# Patient Record
Sex: Female | Born: 1935
Health system: Southern US, Community
[De-identification: ages and names within clinical notes are randomized; demographics above are authoritative.]

## PROBLEM LIST (undated history)

## (undated) DIAGNOSIS — C801 Malignant (primary) neoplasm, unspecified: Secondary | ICD-10-CM

## (undated) DIAGNOSIS — I10 Essential (primary) hypertension: Secondary | ICD-10-CM

## (undated) HISTORY — PX: ABDOMINAL HYSTERECTOMY: SHX81

## (undated) HISTORY — PX: MASTECTOMY: SHX3

---

## 2004-07-11 ENCOUNTER — Ambulatory Visit: Payer: Self-pay | Admitting: Internal Medicine

## 2005-01-08 ENCOUNTER — Ambulatory Visit: Payer: Self-pay | Admitting: Oncology

## 2005-07-15 ENCOUNTER — Ambulatory Visit: Payer: Self-pay | Admitting: Internal Medicine

## 2006-01-07 ENCOUNTER — Ambulatory Visit: Payer: Self-pay | Admitting: Oncology

## 2006-01-24 ENCOUNTER — Emergency Department: Payer: Self-pay | Admitting: Emergency Medicine

## 2006-08-09 ENCOUNTER — Ambulatory Visit: Payer: Self-pay | Admitting: Internal Medicine

## 2007-07-28 ENCOUNTER — Emergency Department: Payer: Self-pay

## 2007-09-26 ENCOUNTER — Ambulatory Visit: Payer: Self-pay | Admitting: Internal Medicine

## 2008-09-26 ENCOUNTER — Ambulatory Visit: Payer: Self-pay | Admitting: Internal Medicine

## 2009-10-01 ENCOUNTER — Ambulatory Visit: Payer: Self-pay | Admitting: Internal Medicine

## 2010-10-02 ENCOUNTER — Ambulatory Visit: Payer: Self-pay | Admitting: Internal Medicine

## 2011-10-07 ENCOUNTER — Ambulatory Visit: Payer: Self-pay | Admitting: Internal Medicine

## 2012-10-10 ENCOUNTER — Ambulatory Visit: Payer: Self-pay | Admitting: Internal Medicine

## 2013-10-11 ENCOUNTER — Ambulatory Visit: Payer: Self-pay | Admitting: Internal Medicine

## 2013-12-13 ENCOUNTER — Emergency Department: Payer: Self-pay | Admitting: Emergency Medicine

## 2013-12-13 LAB — COMPREHENSIVE METABOLIC PANEL
ALBUMIN: 4 g/dL (ref 3.4–5.0)
ALK PHOS: 58 U/L
Anion Gap: 2 — ABNORMAL LOW (ref 7–16)
BUN: 11 mg/dL (ref 7–18)
Bilirubin,Total: 0.6 mg/dL (ref 0.2–1.0)
Calcium, Total: 8.9 mg/dL (ref 8.5–10.1)
Chloride: 107 mmol/L (ref 98–107)
Co2: 28 mmol/L (ref 21–32)
Creatinine: 0.53 mg/dL — ABNORMAL LOW (ref 0.60–1.30)
Glucose: 85 mg/dL (ref 65–99)
Osmolality: 272 (ref 275–301)
Potassium: 4.2 mmol/L (ref 3.5–5.1)
SGOT(AST): 32 U/L (ref 15–37)
SGPT (ALT): 19 U/L (ref 12–78)
Sodium: 137 mmol/L (ref 136–145)
Total Protein: 8.1 g/dL (ref 6.4–8.2)

## 2013-12-13 LAB — CBC
HCT: 38.7 % (ref 35.0–47.0)
HGB: 12.6 g/dL (ref 12.0–16.0)
MCH: 27.4 pg (ref 26.0–34.0)
MCHC: 32.7 g/dL (ref 32.0–36.0)
MCV: 84 fL (ref 80–100)
Platelet: 190 10*3/uL (ref 150–440)
RBC: 4.62 10*6/uL (ref 3.80–5.20)
RDW: 14.4 % (ref 11.5–14.5)
WBC: 4.6 10*3/uL (ref 3.6–11.0)

## 2013-12-13 LAB — TROPONIN I: Troponin-I: 0.03 ng/mL

## 2014-10-12 ENCOUNTER — Ambulatory Visit: Payer: Self-pay | Admitting: Internal Medicine

## 2014-10-12 DIAGNOSIS — Z1231 Encounter for screening mammogram for malignant neoplasm of breast: Secondary | ICD-10-CM | POA: Diagnosis not present

## 2014-10-12 DIAGNOSIS — Z853 Personal history of malignant neoplasm of breast: Secondary | ICD-10-CM | POA: Diagnosis not present

## 2014-11-05 DIAGNOSIS — I10 Essential (primary) hypertension: Secondary | ICD-10-CM | POA: Diagnosis not present

## 2015-05-06 DIAGNOSIS — N390 Urinary tract infection, site not specified: Secondary | ICD-10-CM | POA: Diagnosis not present

## 2015-05-06 DIAGNOSIS — E780 Pure hypercholesterolemia: Secondary | ICD-10-CM | POA: Diagnosis not present

## 2015-05-06 DIAGNOSIS — E538 Deficiency of other specified B group vitamins: Secondary | ICD-10-CM | POA: Diagnosis not present

## 2015-05-06 DIAGNOSIS — Z0001 Encounter for general adult medical examination with abnormal findings: Secondary | ICD-10-CM | POA: Diagnosis not present

## 2015-05-06 DIAGNOSIS — I10 Essential (primary) hypertension: Secondary | ICD-10-CM | POA: Diagnosis not present

## 2016-04-21 DIAGNOSIS — I10 Essential (primary) hypertension: Secondary | ICD-10-CM | POA: Diagnosis not present

## 2016-05-10 ENCOUNTER — Observation Stay
Admission: EM | Admit: 2016-05-10 | Discharge: 2016-05-11 | Disposition: A | Discharge status: Home / Self Care | Payer: Commercial Managed Care - HMO | Attending: Internal Medicine | Admitting: Internal Medicine

## 2016-05-10 ENCOUNTER — Emergency Department: Payer: Commercial Managed Care - HMO

## 2016-05-10 ENCOUNTER — Encounter: Payer: Self-pay | Admitting: Emergency Medicine

## 2016-05-10 DIAGNOSIS — Z8249 Family history of ischemic heart disease and other diseases of the circulatory system: Secondary | ICD-10-CM | POA: Insufficient documentation

## 2016-05-10 DIAGNOSIS — Z9010 Acquired absence of unspecified breast and nipple: Secondary | ICD-10-CM | POA: Insufficient documentation

## 2016-05-10 DIAGNOSIS — I10 Essential (primary) hypertension: Principal | ICD-10-CM | POA: Insufficient documentation

## 2016-05-10 DIAGNOSIS — Z853 Personal history of malignant neoplasm of breast: Secondary | ICD-10-CM | POA: Insufficient documentation

## 2016-05-10 DIAGNOSIS — R7989 Other specified abnormal findings of blood chemistry: Secondary | ICD-10-CM | POA: Diagnosis not present

## 2016-05-10 DIAGNOSIS — I160 Hypertensive urgency: Secondary | ICD-10-CM | POA: Diagnosis not present

## 2016-05-10 DIAGNOSIS — I6782 Cerebral ischemia: Secondary | ICD-10-CM | POA: Insufficient documentation

## 2016-05-10 DIAGNOSIS — R1110 Vomiting, unspecified: Secondary | ICD-10-CM | POA: Insufficient documentation

## 2016-05-10 DIAGNOSIS — Z90710 Acquired absence of both cervix and uterus: Secondary | ICD-10-CM | POA: Diagnosis not present

## 2016-05-10 DIAGNOSIS — R42 Dizziness and giddiness: Secondary | ICD-10-CM | POA: Insufficient documentation

## 2016-05-10 DIAGNOSIS — Z79899 Other long term (current) drug therapy: Secondary | ICD-10-CM | POA: Diagnosis not present

## 2016-05-10 DIAGNOSIS — I6523 Occlusion and stenosis of bilateral carotid arteries: Secondary | ICD-10-CM | POA: Diagnosis not present

## 2016-05-10 DIAGNOSIS — R748 Abnormal levels of other serum enzymes: Secondary | ICD-10-CM | POA: Diagnosis not present

## 2016-05-10 DIAGNOSIS — R778 Other specified abnormalities of plasma proteins: Secondary | ICD-10-CM

## 2016-05-10 HISTORY — DX: Malignant (primary) neoplasm, unspecified: C80.1

## 2016-05-10 HISTORY — DX: Essential (primary) hypertension: I10

## 2016-05-10 LAB — COMPREHENSIVE METABOLIC PANEL
ALT: 11 U/L — ABNORMAL LOW (ref 14–54)
AST: 19 U/L (ref 15–41)
Albumin: 4 g/dL (ref 3.5–5.0)
Alkaline Phosphatase: 42 U/L (ref 38–126)
Anion gap: 7 (ref 5–15)
BUN: 22 mg/dL — ABNORMAL HIGH (ref 6–20)
CHLORIDE: 106 mmol/L (ref 101–111)
CO2: 26 mmol/L (ref 22–32)
Calcium: 9.3 mg/dL (ref 8.9–10.3)
Creatinine, Ser: 0.73 mg/dL (ref 0.44–1.00)
Glucose, Bld: 105 mg/dL — ABNORMAL HIGH (ref 65–99)
POTASSIUM: 3.6 mmol/L (ref 3.5–5.1)
Sodium: 139 mmol/L (ref 135–145)
Total Bilirubin: 0.8 mg/dL (ref 0.3–1.2)
Total Protein: 7 g/dL (ref 6.5–8.1)

## 2016-05-10 LAB — CBC
HCT: 35.6 % (ref 35.0–47.0)
Hemoglobin: 12 g/dL (ref 12.0–16.0)
MCH: 28.8 pg (ref 26.0–34.0)
MCHC: 33.9 g/dL (ref 32.0–36.0)
MCV: 84.9 fL (ref 80.0–100.0)
PLATELETS: 165 10*3/uL (ref 150–440)
RBC: 4.19 MIL/uL (ref 3.80–5.20)
RDW: 14.4 % (ref 11.5–14.5)
WBC: 5.3 10*3/uL (ref 3.6–11.0)

## 2016-05-10 LAB — TROPONIN I: TROPONIN I: 0.04 ng/mL — AB (ref <0.03)

## 2016-05-10 MED ORDER — ACETAMINOPHEN 650 MG RE SUPP
650.0000 mg | Freq: Four times a day (QID) | RECTAL | Status: DC | PRN
Start: 2016-05-10 — End: 2016-05-11

## 2016-05-10 MED ORDER — IOPAMIDOL (ISOVUE-370) INJECTION 76%
75.0000 mL | Freq: Once | INTRAVENOUS | Status: AC | PRN
  Administered 2016-05-10: 75 mL via INTRAVENOUS

## 2016-05-10 MED ORDER — HYDRALAZINE HCL 20 MG/ML IJ SOLN: 10.0000 mg | INTRAMUSCULAR | Status: DC | PRN

## 2016-05-10 MED ORDER — ENOXAPARIN SODIUM 40 MG/0.4ML ~~LOC~~ SOLN
40.0000 mg | SUBCUTANEOUS | Status: DC
  Administered 2016-05-10: 40 mg via SUBCUTANEOUS
  Filled 2016-05-10: qty 0.4

## 2016-05-10 MED ORDER — ONDANSETRON HCL 4 MG/2ML IJ SOLN: 4.0000 mg | Freq: Four times a day (QID) | INTRAMUSCULAR | Status: DC | PRN

## 2016-05-10 MED ORDER — ONDANSETRON HCL 4 MG PO TABS: 4.0000 mg | ORAL_TABLET | Freq: Four times a day (QID) | ORAL | Status: DC | PRN

## 2016-05-10 MED ORDER — OXYCODONE HCL 5 MG PO TABS: 5.0000 mg | ORAL_TABLET | ORAL | Status: DC | PRN

## 2016-05-10 MED ORDER — SODIUM CHLORIDE 0.9% FLUSH
3.0000 mL | Freq: Two times a day (BID) | INTRAVENOUS | Status: DC
  Administered 2016-05-10 – 2016-05-11 (×3): 3 mL via INTRAVENOUS

## 2016-05-10 MED ORDER — ACETAMINOPHEN 325 MG PO TABS
650.0000 mg | ORAL_TABLET | Freq: Four times a day (QID) | ORAL | Status: DC | PRN
Start: 2016-05-10 — End: 2016-05-11

## 2016-05-10 MED ORDER — VERAPAMIL HCL ER 240 MG PO TBCR
240.0000 mg | EXTENDED_RELEASE_TABLET | Freq: Every evening | ORAL | Status: DC
  Administered 2016-05-10: 240 mg via ORAL
  Filled 2016-05-10 (×2): qty 1

## 2016-05-10 MED ORDER — ASPIRIN 81 MG PO CHEW
324.0000 mg | CHEWABLE_TABLET | Freq: Once | ORAL | Status: AC
  Administered 2016-05-10: 324 mg via ORAL
  Filled 2016-05-10: qty 4

## 2016-05-10 MED ORDER — CLONIDINE HCL 0.1 MG PO TABS
0.1000 mg | ORAL_TABLET | Freq: Once | ORAL | Status: AC
  Administered 2016-05-10: 0.1 mg via ORAL
  Filled 2016-05-10: qty 1

## 2016-05-10 NOTE — ED Provider Notes (Signed)
   Imaging results of CT a head and neck: IMPRESSION: No significant carotid or vertebral artery stenosis in the neck  Atherosclerotic disease in the cavernous carotid bilaterally with mild stenosis on the right and moderate stenosis on the left. Mild disease in the M1 segment bilaterally. Mild stenosis in the proximal posterior cerebral artery bilaterally. No emergent large vessel Occlusion.  ______________________________________   I reviewed the CTA head and neck and there is no evidence of acute occlusion amenable to intervention.  I spoke with the hospitalist, and Dr. Owens Shark had previously spoke with hospitalist for admission.    Lisa Roca, MD 05/10/16 [phone]

## 2016-05-10 NOTE — Care Management Obs Status (Signed)
[facility] NOTIFICATION   Patient Details  Name: Brittany Christian MRN: [id number] Date of Birth: 03-May-1931   Medicare Observation Status Notification Given:   Yes    CrutchfieldAntony Haste, RN 05/10/2016, 8:06 PM

## 2016-05-10 NOTE — Progress Notes (Signed)
Patient admitted to unit. Oriented to room, call bell, and staff. Bed in lowest position. Fall safety plan reviewed. Full assessment to Epic. Skin assessment verified with Charlean Sanfilippo. Telemetry box verification with tele clerk and Gerald Stabs NT- Box#: [id number]. Will continue to monitor. Multiple family members at bedside.

## 2016-05-10 NOTE — ED Triage Notes (Signed)
Patient presents with c/o of acute onset of N/V, weakness, and feeling "out of it". Symptoms started after eating "some chicken". Patient seen by PCP last week and has was given new medication for her BP.

## 2016-05-10 NOTE — ED Notes (Signed)
Dr. Owens Shark to bedside for rapid patient assessment and evaluation.

## 2016-05-10 NOTE — H&P (Signed)
[facility] [place] NAME: Brittany Christian    MR#:  [id number]  DATE OF BIRTH:  08-04-1931   DATE OF ADMISSION:  05/10/2016  PRIMARY CARE PHYSICIAN: Madelyn Brunner, MD   REQUESTING/REFERRING PHYSICIAN: Lord  CHIEF COMPLAINT:   Chief Complaint  Patient presents with  . Weakness  . Nausea  . Emesis    HISTORY OF PRESENT ILLNESS:  Brittany Christian  is a 82 y.o. female with a known history of Essential hypertension who is presenting with nauseousness and dizziness. She states that had one day duration of symptoms of nauseousness with nonbloody nonbilious emesis, generalized weakness, dizziness which she states "felt like I was going to pass out" she denies any vertiginous symptoms. Because of these symptoms present Hospital further workup and evaluation. On arrival noted to have elevated blood pressure 200s over 80s. Had a repeat episode of dizziness which at that time led emergency room provider to be concern for posterior circulation issue. CTA performed within normal limits  PAST MEDICAL HISTORY:   Past Medical History:  Diagnosis Date  . Cancer ([place])   . Hypertension     PAST SURGICAL HISTORY:   Past Surgical History:  Procedure Laterality Date  . ABDOMINAL HYSTERECTOMY    . MASTECTOMY      SOCIAL HISTORY:   Social History  Substance Use Topics  . Smoking status: Never Smoker  . Smokeless tobacco: Never Used  . Alcohol use No    FAMILY HISTORY:   Family History  Problem Relation Age of Onset  . Hypertension Other     DRUG ALLERGIES:  No Known Allergies  REVIEW OF SYSTEMS:  REVIEW OF SYSTEMS:  CONSTITUTIONAL: Denies fevers, chills, Positive fatigue, weakness.  EYES: Denies blurred vision, double vision, or eye pain.  EARS, NOSE, THROAT: Denies tinnitus, ear pain, hearing loss.  RESPIRATORY: denies cough, shortness of breath, wheezing  CARDIOVASCULAR: Denies chest pain, palpitations, edema.  GASTROINTESTINAL:  Denies nausea, vomiting, diarrhea, abdominal pain.  GENITOURINARY: Denies dysuria, hematuria.  ENDOCRINE: Denies nocturia or thyroid problems. HEMATOLOGIC AND LYMPHATIC: Denies easy bruising or bleeding.  SKIN: Denies rash or lesions.  MUSCULOSKELETAL: Denies pain in neck, back, shoulder, knees, hips, or further arthritic symptoms.  NEUROLOGIC: Denies paralysis, paresthesias.  PSYCHIATRIC: Denies anxiety or depressive symptoms. Otherwise full review of systems performed by me is negative.   MEDICATIONS AT HOME:   Prior to Admission medications   Medication Sig Start Date End Date Taking? Authorizing Provider  verapamil (CALAN-SR) 240 MG CR tablet Take 240 mg by mouth every evening.   Yes Historical Provider, MD      VITAL SIGNS:  Blood pressure (!) 164/70, pulse 65, resp. rate 18, SpO2 100 %.  PHYSICAL EXAMINATION:  VITAL SIGNS: Vitals:   05/10/16 0830 05/10/16 0900  BP: (!) 148/70 (!) 164/70  Pulse: (!) 55 65  Resp: 57 18   GENERAL:82 y.o.female currently in no acute distress.  HEAD: Normocephalic, atraumatic.  EYES: Pupils equal, round, reactive to light. Extraocular muscles intact. No scleral icterus.  MOUTH: Moist mucosal membrane. Dentition intact. No abscess noted.  EAR, NOSE, THROAT: Clear without exudates. No external lesions.  NECK: Supple. No thyromegaly. No nodules. No JVD.  PULMONARY: Clear to ascultation, without wheeze rails or rhonci. No use of accessory muscles, Good respiratory effort. good air entry bilaterally CHEST: Nontender to palpation.  CARDIOVASCULAR: S1 and S2. Regular rate and rhythm. No murmurs, rubs, or gallops. No edema. Pedal pulses 2+ bilaterally.  GASTROINTESTINAL: Soft, nontender, nondistended. No masses. Positive bowel sounds. No hepatosplenomegaly.  MUSCULOSKELETAL: No swelling, clubbing, or edema. Range of motion full in all extremities.  NEUROLOGIC: Cranial nerves II through XII are intact. No gross focal neurological deficits. Sensation  intact. Reflexes intact.  SKIN: No ulceration, lesions, rashes, or cyanosis. Skin warm and dry. Turgor intact.  PSYCHIATRIC: Mood, affect within normal limits. The patient is awake, alert and oriented x 3. Insight, judgment intact.    LABORATORY PANEL:   CBC  Recent Labs Lab 05/10/16 0458  WBC 5.3  HGB 12.0  HCT 35.6  PLT 165   ------------------------------------------------------------------------------------------------------------------  Chemistries   Recent Labs Lab 05/10/16 0458  NA 139  K 3.6  CL 106  CO2 26  GLUCOSE 105*  BUN 22*  CREATININE 0.73  CALCIUM 9.3  AST 19  ALT 11*  ALKPHOS 42  BILITOT 0.8   ------------------------------------------------------------------------------------------------------------------  Cardiac Enzymes  Recent Labs Lab 05/10/16 0458  TROPONINI 0.04*   ------------------------------------------------------------------------------------------------------------------  RADIOLOGY:  Ct Angio Head W Or Wo Contrast  Result Date: 05/10/2016 CLINICAL DATA:  Dizziness.  Hypertension.  History of breast cancer EXAM: CT ANGIOGRAPHY HEAD AND NECK TECHNIQUE: Multidetector CT imaging of the head and neck was performed using the standard protocol during bolus administration of intravenous contrast. Multiplanar CT image reconstructions and MIPs were obtained to evaluate the vascular anatomy. Carotid stenosis measurements (when applicable) are obtained utilizing NASCET criteria, using the distal internal carotid diameter as the denominator. CONTRAST:  75 mL Isovue 370 IV COMPARISON:  CT head 05/10/2016 FINDINGS: CTA NECK Aortic arch: Mild atherosclerotic disease in the aortic arch. Proximal great vessels widely patent with mild atherosclerotic disease in the proximal left subclavian artery. Right carotid system: Right common carotid artery widely patent. Mild atherosclerotic disease in the carotid bulb without significant stenosis Left carotid  system: Left common carotid artery widely patent. Minimal atherosclerotic disease at the left carotid bifurcation without significant stenosis Vertebral arteries:Both vertebral arteries are equal in size and patent to the basilar without significant stenosis. Skeleton: Moderate to advanced cervical disc and facet degeneration. No acute skeletal abnormality. Other neck: Negative for mass or adenopathy Upper chest: Lung apices clear. CTA HEAD Anterior circulation: Atherosclerotic calcification and mild stenosis in the right cavernous carotid. Moderate stenosis left cavernous carotid. Mild atherosclerotic disease in the M1 segment bilaterally. Middle cerebral artery branches widely patent bilaterally. Posterior circulation: Both vertebral arteries patent to the basilar. PICA patent bilaterally. Basilar widely patent. AICA, superior cerebellar, posterior cerebral arteries patent bilaterally. Mild stenosis of the proximal posterior cerebral artery bilaterally. Venous sinuses: Patent Anatomic variants: Negative for aneurysm. Delayed phase: Normal enhancement on delayed imaging. No enhancing mass lesion. Atrophy and chronic microvascular ischemic changes. IMPRESSION: No significant carotid or vertebral artery stenosis in the neck Atherosclerotic disease in the cavernous carotid bilaterally with mild stenosis on the right and moderate stenosis on the left. Mild disease in the M1 segment bilaterally. Mild stenosis in the proximal posterior cerebral artery bilaterally. No emergent large vessel occlusion. Electronically Signed   By: Franchot Gallo M.D.   On: 05/10/2016 09:48   Ct Head Wo Contrast  Result Date: 05/10/2016 CLINICAL DATA:  Acute onset of nausea, vomiting and generalized weakness. Initial encounter. EXAM: CT HEAD WITHOUT CONTRAST TECHNIQUE: Contiguous axial images were obtained from the base of the skull through the vertex without intravenous contrast. COMPARISON:  None. FINDINGS: There is no evidence of acute  infarction, mass lesion, or intra- or extra-axial hemorrhage on CT. Prominence of the ventricles  and sulci reflects mild cortical volume loss. Mild cerebellar atrophy is noted. Scattered periventricular and subcortical white matter change likely reflects small vessel ischemic microangiopathy. The brainstem and fourth ventricle are within normal limits. The basal ganglia are unremarkable in appearance. The cerebral hemispheres demonstrate grossly normal gray-white differentiation. No mass effect or midline shift is seen. There is no evidence of fracture; visualized osseous structures are unremarkable in appearance. The visualized portions of the orbits are within normal limits. The paranasal sinuses and mastoid air cells are well-aerated. No significant soft tissue abnormalities are seen. IMPRESSION: 1. No acute intracranial pathology seen on CT. 2. Mild cortical volume loss and scattered small vessel ischemic microangiopathy. Electronically Signed   By: Garald Balding M.D.   On: 05/10/2016 06:00   Ct Angio Neck W Or Wo Contrast  Result Date: 05/10/2016 CLINICAL DATA:  Dizziness.  Hypertension.  History of breast cancer EXAM: CT ANGIOGRAPHY HEAD AND NECK TECHNIQUE: Multidetector CT imaging of the head and neck was performed using the standard protocol during bolus administration of intravenous contrast. Multiplanar CT image reconstructions and MIPs were obtained to evaluate the vascular anatomy. Carotid stenosis measurements (when applicable) are obtained utilizing NASCET criteria, using the distal internal carotid diameter as the denominator. CONTRAST:  75 mL Isovue 370 IV COMPARISON:  CT head 05/10/2016 FINDINGS: CTA NECK Aortic arch: Mild atherosclerotic disease in the aortic arch. Proximal great vessels widely patent with mild atherosclerotic disease in the proximal left subclavian artery. Right carotid system: Right common carotid artery widely patent. Mild atherosclerotic disease in the carotid bulb without  significant stenosis Left carotid system: Left common carotid artery widely patent. Minimal atherosclerotic disease at the left carotid bifurcation without significant stenosis Vertebral arteries:Both vertebral arteries are equal in size and patent to the basilar without significant stenosis. Skeleton: Moderate to advanced cervical disc and facet degeneration. No acute skeletal abnormality. Other neck: Negative for mass or adenopathy Upper chest: Lung apices clear. CTA HEAD Anterior circulation: Atherosclerotic calcification and mild stenosis in the right cavernous carotid. Moderate stenosis left cavernous carotid. Mild atherosclerotic disease in the M1 segment bilaterally. Middle cerebral artery branches widely patent bilaterally. Posterior circulation: Both vertebral arteries patent to the basilar. PICA patent bilaterally. Basilar widely patent. AICA, superior cerebellar, posterior cerebral arteries patent bilaterally. Mild stenosis of the proximal posterior cerebral artery bilaterally. Venous sinuses: Patent Anatomic variants: Negative for aneurysm. Delayed phase: Normal enhancement on delayed imaging. No enhancing mass lesion. Atrophy and chronic microvascular ischemic changes. IMPRESSION: No significant carotid or vertebral artery stenosis in the neck Atherosclerotic disease in the cavernous carotid bilaterally with mild stenosis on the right and moderate stenosis on the left. Mild disease in the M1 segment bilaterally. Mild stenosis in the proximal posterior cerebral artery bilaterally. No emergent large vessel occlusion. Electronically Signed   By: Franchot Gallo M.D.   On: 05/10/2016 09:48    EKG:   Orders placed or performed during the hospital encounter of 05/10/16  . EKG 12-Lead  . EKG 12-Lead    IMPRESSION AND PLAN:   82 year old African-American female history of essential hypertension presenting with generalized weakness and dizziness  1. Hypertensive urgency: Restart home medications,  add as needed hydralazine 2. Borderline troponin: Likely demand trend cardiac enzymes 3 placed on telemetry 3. Venous thrombi embolism prophylactic: Lovenox    All the records are reviewed and case discussed with ED provider. Management plans discussed with the patient, family and they are in agreement.  CODE STATUS: Full  TOTAL TIME TAKING CARE OF  THIS PATIENT: 33 minutes.    Stryker Veasey,  Karenann Cai.D on 05/10/2016 at 10:48 AM  Between 7am to 6pm - Pager - [phone]  After 6pm: House Pager: - [phone]  [facility]  Office  [phone]  CC: Primary care physician; Madelyn Brunner, MD

## 2016-05-10 NOTE — ED Notes (Signed)
Patient transported to CT 

## 2016-05-10 NOTE — ED Notes (Signed)
Pt alert and oriented. NAD. Respirations unlabored. Skin warm dry and pink. No needs at this time.

## 2016-05-10 NOTE — ED Provider Notes (Signed)
[facility] Emergency Department Provider Note  ____________________________________________   First MD Initiated Contact with Patient 05/10/16 [phone]     (approximate)  I have reviewed the triage vital signs and the nursing notes.   HISTORY  Chief Complaint Weakness; Nausea; and Emesis    HPI Brittany Christian is a 82 y.o. female presents with acute onset of generalized weakness nausea and 1 episode of nonbloody emesis before presentation to the emergency department. Patient states that she had a similar episode last Sunday while at church   Past medical history Hypertension Breast cancer There are no active problems to display for this patient.  Past surgical history None  Prior to Admission medications   Medication Sig Start Date End Date Taking? Authorizing Provider  verapamil (CALAN-SR) 240 MG CR tablet Take 240 mg by mouth every evening.   Yes Historical Provider, MD    Allergies No known drug allergies No family history on file.  Social History Social History  Substance Use Topics  . Smoking status: Not on file  . Smokeless tobacco: Not on file  . Alcohol use Not on file    Review of Systems Constitutional: No fever/chills Eyes: No visual changes. ENT: No sore throat. Cardiovascular: Denies chest pain. Respiratory: Denies shortness of breath. Gastrointestinal: No abdominal pain.  No nausea, no vomiting.  No diarrhea.  No constipation. Genitourinary: Negative for dysuria. Musculoskeletal: Negative for back pain. Skin: Negative for rash. Neurological: Negative for headaches, focal weakness or numbness.  10-point ROS otherwise negative.  ____________________________________________   PHYSICAL EXAM:  VITAL SIGNS: ED Triage Vitals  Enc Vitals Group     BP 05/10/16 0443 (!) 202/82     Pulse Rate 05/10/16 0443 67     Resp 05/10/16 0443 14     Temp --      Temp src --      SpO2 05/10/16 0443 100 %     Weight --    Height --      Head Circumference --      Peak Flow --      Pain Score 05/10/16 0442 0     Pain Loc --      Pain Edu? --      Excl. in [place]? --     Constitutional: Alert and oriented. Well appearing and in no acute distress. Eyes: Conjunctivae are normal. PERRL. EOMI. Head: Atraumatic. Ears:  Healthy appearing ear canals and TMs bilaterally Nose: No congestion/rhinnorhea. Mouth/Throat: Mucous membranes are moist.  Oropharynx non-erythematous. Neck: No stridor.  No meningeal signs.   Cardiovascular: Normal rate, regular rhythm. Good peripheral circulation. Grossly normal heart sounds.   Respiratory: Normal respiratory effort.  No retractions. Lungs CTAB. Gastrointestinal: Soft and nontender. No distention.  Musculoskeletal: No lower extremity tenderness nor edema. No gross deformities of extremities. Neurologic:  Normal speech and language. No gross focal neurologic deficits are appreciated.  Skin:  Skin is warm, dry and intact. No rash noted. Psychiatric: Mood and affect are normal. Speech and behavior are normal.  ____________________________________________   LABS (all labs ordered are listed, but only abnormal results are displayed)  Labs Reviewed  COMPREHENSIVE METABOLIC PANEL - Abnormal; Notable for the following:       Result Value   Glucose, Bld 105 (*)    BUN 22 (*)    ALT 11 (*)    All other components within normal limits  TROPONIN I - Abnormal; Notable for the following:    Troponin I 0.04 (*)  All other components within normal limits  CBC   ____________________________________________  EKG  ED ECG REPORT I, Sciota N Zawadi Aplin, the attending physician, personally viewed and interpreted this ECG.   Date: 05/10/2016  EKG Time: 4:48 AM  Rate: 57  Rhythm: Normal sinus rhythm  Axis: Normal  Intervals: Normal  ST&T Change: None  ____________________________________________  RADIOLOGY I, [place] N Munir Victorian, personally viewed and evaluated these images  (plain radiographs) as part of my medical decision making, as well as reviewing the written report by the radiologist.  Ct Head Wo Contrast  Result Date: 05/10/2016 CLINICAL DATA:  Acute onset of nausea, vomiting and generalized weakness. Initial encounter. EXAM: CT HEAD WITHOUT CONTRAST TECHNIQUE: Contiguous axial images were obtained from the base of the skull through the vertex without intravenous contrast. COMPARISON:  None. FINDINGS: There is no evidence of acute infarction, mass lesion, or intra- or extra-axial hemorrhage on CT. Prominence of the ventricles and sulci reflects mild cortical volume loss. Mild cerebellar atrophy is noted. Scattered periventricular and subcortical white matter change likely reflects small vessel ischemic microangiopathy. The brainstem and fourth ventricle are within normal limits. The basal ganglia are unremarkable in appearance. The cerebral hemispheres demonstrate grossly normal gray-white differentiation. No mass effect or midline shift is seen. There is no evidence of fracture; visualized osseous structures are unremarkable in appearance. The visualized portions of the orbits are within normal limits. The paranasal sinuses and mastoid air cells are well-aerated. No significant soft tissue abnormalities are seen. IMPRESSION: 1. No acute intracranial pathology seen on CT. 2. Mild cortical volume loss and scattered small vessel ischemic microangiopathy. Electronically Signed   By: Garald Balding M.D.   On: 05/10/2016 06:00    Procedures      INITIAL IMPRESSION / ASSESSMENT AND PLAN / ED COURSE  Pertinent labs & imaging results that were available during my care of the patient were reviewed by me and considered in my medical decision making (see chart for details).  History of physical exam concerning for possible demand ischemia given elevated troponin with marked hypertension. Also concern for possible posterior circulation pathology given patient's  intermittent dizziness weakness in the setting of marked the elevated blood pressure. Patient discussed with Dr. Marcille Blanco for hospital admission for further evaluation and management.  Clinical Course    ____________________________________________  FINAL CLINICAL IMPRESSION(S) / ED DIAGNOSES  Final diagnoses:  Elevated troponin  Essential hypertension     MEDICATIONS GIVEN DURING THIS VISIT:  Medications  cloNIDine (CATAPRES) tablet 0.1 mg (0.1 mg Oral Given 05/10/16 0613)     NEW OUTPATIENT MEDICATIONS STARTED DURING THIS VISIT:  New Prescriptions   No medications on file      Note:  This document was prepared using Dragon voice recognition software and may include unintentional dictation errors.    Gregor Hams, MD 05/10/16 [phone]

## 2016-05-11 DIAGNOSIS — R748 Abnormal levels of other serum enzymes: Secondary | ICD-10-CM | POA: Diagnosis not present

## 2016-05-11 DIAGNOSIS — I160 Hypertensive urgency: Secondary | ICD-10-CM | POA: Diagnosis not present

## 2016-05-11 LAB — BASIC METABOLIC PANEL
Anion gap: 6 (ref 5–15)
BUN: 20 mg/dL (ref 6–20)
CALCIUM: 9.1 mg/dL (ref 8.9–10.3)
CO2: 25 mmol/L (ref 22–32)
CREATININE: 0.63 mg/dL (ref 0.44–1.00)
Chloride: 106 mmol/L (ref 101–111)
GFR calc Af Amer: >60 mL/min (ref >60)
Glucose, Bld: 89 mg/dL (ref 65–99)
Potassium: 3.7 mmol/L (ref 3.5–5.1)
SODIUM: 137 mmol/L (ref 135–145)

## 2016-05-11 LAB — CBC
HCT: 35.4 % (ref 35.0–47.0)
Hemoglobin: 12 g/dL (ref 12.0–16.0)
MCH: 28.6 pg (ref 26.0–34.0)
MCHC: 34 g/dL (ref 32.0–36.0)
MCV: 84.2 fL (ref 80.0–100.0)
PLATELETS: 186 10*3/uL (ref 150–440)
RBC: 4.21 MIL/uL (ref 3.80–5.20)
RDW: 14.4 % (ref 11.5–14.5)
WBC: 4.5 10*3/uL (ref 3.6–11.0)

## 2016-05-11 NOTE — Discharge Summary (Signed)
[facility] NAME: Debborah Ghezzi    MR#:  [id number]  DATE OF BIRTH:  1931/04/15  DATE OF ADMISSION:  05/10/2016 ADMITTING PHYSICIAN: Lytle Butte, MD  DATE OF DISCHARGE: 05/11/16  PRIMARY CARE PHYSICIAN: Madelyn Brunner, MD    ADMISSION DIAGNOSIS:  Dizziness [R42] Hypertension [I10] Elevated troponin [R79.89] Essential hypertension [I10]  DISCHARGE DIAGNOSIS:  Malignant HTN Vomiting with dizziness-resolved  SECONDARY DIAGNOSIS:   Past Medical History:  Diagnosis Date  . Cancer ([place])   . Hypertension     HOSPITAL COURSE:  82 year old African-American female history of essential hypertension presenting with generalized weakness and dizziness  1. Hypertensive urgency: Restart home medications, add as needed hydralazine -BP muh impoved -no vomiting  2. Borderline troponin: Likely demand ischemia from HTN -no cp  3. Venous thrombi embolism prophylactic: Lovenox  Overall improved from her presenting symptoms. Will d/c her after lunch  CONSULTS OBTAINED:  Treatment Team:  Lytle Butte, MD  DRUG ALLERGIES:  No Known Allergies  DISCHARGE MEDICATIONS:   Current Discharge Medication List    CONTINUE these medications which have NOT CHANGED   Details  verapamil (CALAN-SR) 240 MG CR tablet Take 240 mg by mouth every evening.        If you experience worsening of your admission symptoms, develop shortness of breath, life threatening emergency, suicidal or homicidal thoughts you must seek medical attention immediately by calling 911 or calling your MD immediately  if symptoms less severe.  You Must read complete instructions/literature along with all the possible adverse reactions/side effects for all the Medicines you take and that have been prescribed to you. Take any new Medicines after you have completely understood and accept all the possible adverse reactions/side effects.   Please note  You  were cared for by a hospitalist during your hospital stay. If you have any questions about your discharge medications or the care you received while you were in the hospital after you are discharged, you can call the unit and asked to speak with the hospitalist on call if the hospitalist that took care of you is not available. Once you are discharged, your primary care physician will handle any further medical issues. Please note that NO REFILLS for any discharge medications will be authorized once you are discharged, as it is imperative that you return to your primary care physician (or establish a relationship with a primary care physician if you do not have one) for your aftercare needs so that they can reassess your need for medications and monitor your lab values. Today   SUBJECTIVE   No new complaints wants to go home  VITAL SIGNS:  Blood pressure (!) 160/82, pulse (!) 52, temperature 98.2 F (36.8 C), temperature source Oral, resp. rate 15, height 5\' 7"  (1.702 m), weight 130 lb (59 kg), SpO2 100 %.  I/O:   Intake/Output Summary (Last 24 hours) at 05/11/16 1114 Last data filed at 05/11/16 1055  Gross per 24 hour  Intake              480 ml  Output             1150 ml  Net             -670 ml    PHYSICAL EXAMINATION:  GENERAL:  82 y.o.-year-old patient lying in the bed with no acute distress.  EYES: Pupils equal, round, reactive to light and accommodation. No scleral icterus. Extraocular  muscles intact.  HEENT: Head atraumatic, normocephalic. Oropharynx and nasopharynx clear.  NECK:  Supple, no jugular venous distention. No thyroid enlargement, no tenderness.  LUNGS: Normal breath sounds bilaterally, no wheezing, rales,rhonchi or crepitation. No use of accessory muscles of respiration.  CARDIOVASCULAR: S1, S2 normal. No murmurs, rubs, or gallops.  ABDOMEN: Soft, non-tender, non-distended. Bowel sounds present. No organomegaly or mass.  EXTREMITIES: No pedal edema, cyanosis, or  clubbing.  NEUROLOGIC: Cranial nerves II through XII are intact. Muscle strength 5/5 in all extremities. Sensation intact. Gait not checked.  PSYCHIATRIC: The patient is alert and oriented x 3.  SKIN: No obvious rash, lesion, or ulcer.   DATA REVIEW:   CBC   Recent Labs Lab 05/11/16 0600  WBC 4.5  HGB 12.0  HCT 35.4  PLT 186    Chemistries   Recent Labs Lab 05/10/16 0458 05/11/16 0600  NA 139 137  K 3.6 3.7  CL 106 106  CO2 26 25  GLUCOSE 105* 89  BUN 22* 20  CREATININE 0.73 0.63  CALCIUM 9.3 9.1  AST 19  --   ALT 11*  --   ALKPHOS 42  --   BILITOT 0.8  --     Microbiology Results   No results found for this or any previous visit (from the past 240 hour(s)).  RADIOLOGY:  Ct Angio Head W Or Wo Contrast  Result Date: 05/10/2016 CLINICAL DATA:  Dizziness.  Hypertension.  History of breast cancer EXAM: CT ANGIOGRAPHY HEAD AND NECK TECHNIQUE: Multidetector CT imaging of the head and neck was performed using the standard protocol during bolus administration of intravenous contrast. Multiplanar CT image reconstructions and MIPs were obtained to evaluate the vascular anatomy. Carotid stenosis measurements (when applicable) are obtained utilizing NASCET criteria, using the distal internal carotid diameter as the denominator. CONTRAST:  75 mL Isovue 370 IV COMPARISON:  CT head 05/10/2016 FINDINGS: CTA NECK Aortic arch: Mild atherosclerotic disease in the aortic arch. Proximal great vessels widely patent with mild atherosclerotic disease in the proximal left subclavian artery. Right carotid system: Right common carotid artery widely patent. Mild atherosclerotic disease in the carotid bulb without significant stenosis Left carotid system: Left common carotid artery widely patent. Minimal atherosclerotic disease at the left carotid bifurcation without significant stenosis Vertebral arteries:Both vertebral arteries are equal in size and patent to the basilar without significant  stenosis. Skeleton: Moderate to advanced cervical disc and facet degeneration. No acute skeletal abnormality. Other neck: Negative for mass or adenopathy Upper chest: Lung apices clear. CTA HEAD Anterior circulation: Atherosclerotic calcification and mild stenosis in the right cavernous carotid. Moderate stenosis left cavernous carotid. Mild atherosclerotic disease in the M1 segment bilaterally. Middle cerebral artery branches widely patent bilaterally. Posterior circulation: Both vertebral arteries patent to the basilar. PICA patent bilaterally. Basilar widely patent. AICA, superior cerebellar, posterior cerebral arteries patent bilaterally. Mild stenosis of the proximal posterior cerebral artery bilaterally. Venous sinuses: Patent Anatomic variants: Negative for aneurysm. Delayed phase: Normal enhancement on delayed imaging. No enhancing mass lesion. Atrophy and chronic microvascular ischemic changes. IMPRESSION: No significant carotid or vertebral artery stenosis in the neck Atherosclerotic disease in the cavernous carotid bilaterally with mild stenosis on the right and moderate stenosis on the left. Mild disease in the M1 segment bilaterally. Mild stenosis in the proximal posterior cerebral artery bilaterally. No emergent large vessel occlusion. Electronically Signed   By: Franchot Gallo M.D.   On: 05/10/2016 09:48   Ct Head Wo Contrast  Result Date: 05/10/2016 CLINICAL DATA:  Acute onset of nausea, vomiting and generalized weakness. Initial encounter. EXAM: CT HEAD WITHOUT CONTRAST TECHNIQUE: Contiguous axial images were obtained from the base of the skull through the vertex without intravenous contrast. COMPARISON:  None. FINDINGS: There is no evidence of acute infarction, mass lesion, or intra- or extra-axial hemorrhage on CT. Prominence of the ventricles and sulci reflects mild cortical volume loss. Mild cerebellar atrophy is noted. Scattered periventricular and subcortical white matter change likely  reflects small vessel ischemic microangiopathy. The brainstem and fourth ventricle are within normal limits. The basal ganglia are unremarkable in appearance. The cerebral hemispheres demonstrate grossly normal gray-white differentiation. No mass effect or midline shift is seen. There is no evidence of fracture; visualized osseous structures are unremarkable in appearance. The visualized portions of the orbits are within normal limits. The paranasal sinuses and mastoid air cells are well-aerated. No significant soft tissue abnormalities are seen. IMPRESSION: 1. No acute intracranial pathology seen on CT. 2. Mild cortical volume loss and scattered small vessel ischemic microangiopathy. Electronically Signed   By: Garald Balding M.D.   On: 05/10/2016 06:00   Ct Angio Neck W Or Wo Contrast  Result Date: 05/10/2016 CLINICAL DATA:  Dizziness.  Hypertension.  History of breast cancer EXAM: CT ANGIOGRAPHY HEAD AND NECK TECHNIQUE: Multidetector CT imaging of the head and neck was performed using the standard protocol during bolus administration of intravenous contrast. Multiplanar CT image reconstructions and MIPs were obtained to evaluate the vascular anatomy. Carotid stenosis measurements (when applicable) are obtained utilizing NASCET criteria, using the distal internal carotid diameter as the denominator. CONTRAST:  75 mL Isovue 370 IV COMPARISON:  CT head 05/10/2016 FINDINGS: CTA NECK Aortic arch: Mild atherosclerotic disease in the aortic arch. Proximal great vessels widely patent with mild atherosclerotic disease in the proximal left subclavian artery. Right carotid system: Right common carotid artery widely patent. Mild atherosclerotic disease in the carotid bulb without significant stenosis Left carotid system: Left common carotid artery widely patent. Minimal atherosclerotic disease at the left carotid bifurcation without significant stenosis Vertebral arteries:Both vertebral arteries are equal in size and  patent to the basilar without significant stenosis. Skeleton: Moderate to advanced cervical disc and facet degeneration. No acute skeletal abnormality. Other neck: Negative for mass or adenopathy Upper chest: Lung apices clear. CTA HEAD Anterior circulation: Atherosclerotic calcification and mild stenosis in the right cavernous carotid. Moderate stenosis left cavernous carotid. Mild atherosclerotic disease in the M1 segment bilaterally. Middle cerebral artery branches widely patent bilaterally. Posterior circulation: Both vertebral arteries patent to the basilar. PICA patent bilaterally. Basilar widely patent. AICA, superior cerebellar, posterior cerebral arteries patent bilaterally. Mild stenosis of the proximal posterior cerebral artery bilaterally. Venous sinuses: Patent Anatomic variants: Negative for aneurysm. Delayed phase: Normal enhancement on delayed imaging. No enhancing mass lesion. Atrophy and chronic microvascular ischemic changes. IMPRESSION: No significant carotid or vertebral artery stenosis in the neck Atherosclerotic disease in the cavernous carotid bilaterally with mild stenosis on the right and moderate stenosis on the left. Mild disease in the M1 segment bilaterally. Mild stenosis in the proximal posterior cerebral artery bilaterally. No emergent large vessel occlusion. Electronically Signed   By: Franchot Gallo M.D.   On: 05/10/2016 09:48     Management plans discussed with the patient, family and they are in agreement.  CODE STATUS:     Code Status Orders        Start     Ordered   05/10/16 1029  Full code  Continuous     05/10/16  88    Code Status History    Date Active Date Inactive Code Status Order ID Comments User Context   This patient has a current code status but no historical code status.      TOTAL TIME TAKING CARE OF THIS PATIENT: 40 minutes.    Demiah Gullickson M.D on 05/11/2016 at 11:14 AM  Between 7am to 6pm - Pager - [phone] After 6pm go to  [email or web address] - password [email or web address] [facility]  Office  [phone]  CC: Primary care physician; Madelyn Brunner, MD

## 2016-05-11 NOTE — Progress Notes (Signed)
Spoke with Dr. Posey Pronto about pt. SBP 160's MD is ok with pt. Discharging

## 2016-05-11 NOTE — Progress Notes (Signed)
Pt. Slept throughout the night with no c/o pain, SOB or acute distress observed. Daughter stayed at bedside throughout the night. Will continue to monitor pt.

## 2016-05-11 NOTE — Progress Notes (Signed)
Discharge instructions given to patient and daughter at bedside with read back. IV removed without difficulty gauze applied. Follow up appointment reviewed.

## 2016-05-11 NOTE — Care Management (Signed)
placed in observation for Malignant hypertension.  She is independent in her adls and denies issues accessing medical care, obtaining meds or with transportation.  Current with PCP. Per health care team- there are no discharge needs.

## 2016-05-11 NOTE — Care Management Obs Status (Signed)
[facility] NOTIFICATION   Patient Details  Name: Brittany Christian MRN: [id number] Date of Birth: 1931-02-01   Medicare Observation Status Notification Given:  Yes Patient had a notice in her room that had been given on 8/13 but not documented.   Katrina Stack, RN 05/11/2016, 9:14 AM

## 2016-05-11 NOTE — Discharge Instructions (Signed)
Keep log of BP at home °

## 2016-05-20 DIAGNOSIS — I10 Essential (primary) hypertension: Secondary | ICD-10-CM | POA: Diagnosis not present

## 2016-12-23 ENCOUNTER — Encounter (HOSPITAL_COMMUNITY): Payer: Self-pay | Admitting: Emergency Medicine

## 2016-12-23 ENCOUNTER — Emergency Department (HOSPITAL_COMMUNITY): Payer: Medicare HMO

## 2016-12-23 ENCOUNTER — Inpatient Hospital Stay (HOSPITAL_COMMUNITY)
Admission: EM | Admit: 2016-12-23 | Discharge: 2017-01-01 | DRG: 025 | Disposition: A | Discharge status: Skilled Nursing Facility | Payer: Medicare HMO | Attending: Neurological Surgery | Admitting: Neurological Surgery

## 2016-12-23 DIAGNOSIS — I629 Nontraumatic intracranial hemorrhage, unspecified: Secondary | ICD-10-CM | POA: Diagnosis not present

## 2016-12-23 DIAGNOSIS — J9601 Acute respiratory failure with hypoxia: Secondary | ICD-10-CM | POA: Diagnosis not present

## 2016-12-23 DIAGNOSIS — R4189 Other symptoms and signs involving cognitive functions and awareness: Secondary | ICD-10-CM

## 2016-12-23 DIAGNOSIS — R401 Stupor: Secondary | ICD-10-CM | POA: Diagnosis not present

## 2016-12-23 DIAGNOSIS — W19XXXA Unspecified fall, initial encounter: Secondary | ICD-10-CM | POA: Diagnosis present

## 2016-12-23 DIAGNOSIS — G9340 Encephalopathy, unspecified: Secondary | ICD-10-CM | POA: Diagnosis present

## 2016-12-23 DIAGNOSIS — I160 Hypertensive urgency: Secondary | ICD-10-CM | POA: Diagnosis present

## 2016-12-23 DIAGNOSIS — I10 Essential (primary) hypertension: Secondary | ICD-10-CM | POA: Diagnosis present

## 2016-12-23 DIAGNOSIS — N179 Acute kidney failure, unspecified: Secondary | ICD-10-CM | POA: Diagnosis present

## 2016-12-23 DIAGNOSIS — I6202 Nontraumatic subacute subdural hemorrhage: Secondary | ICD-10-CM | POA: Diagnosis not present

## 2016-12-23 DIAGNOSIS — Z79899 Other long term (current) drug therapy: Secondary | ICD-10-CM

## 2016-12-23 DIAGNOSIS — R471 Dysarthria and anarthria: Secondary | ICD-10-CM | POA: Diagnosis present

## 2016-12-23 DIAGNOSIS — R262 Difficulty in walking, not elsewhere classified: Secondary | ICD-10-CM | POA: Diagnosis present

## 2016-12-23 DIAGNOSIS — R339 Retention of urine, unspecified: Secondary | ICD-10-CM | POA: Diagnosis present

## 2016-12-23 DIAGNOSIS — S0990XA Unspecified injury of head, initial encounter: Secondary | ICD-10-CM

## 2016-12-23 DIAGNOSIS — R739 Hyperglycemia, unspecified: Secondary | ICD-10-CM | POA: Diagnosis not present

## 2016-12-23 DIAGNOSIS — R4182 Altered mental status, unspecified: Secondary | ICD-10-CM | POA: Diagnosis present

## 2016-12-23 DIAGNOSIS — S065X9A Traumatic subdural hemorrhage with loss of consciousness of unspecified duration, initial encounter: Principal | ICD-10-CM | POA: Diagnosis present

## 2016-12-23 DIAGNOSIS — Z853 Personal history of malignant neoplasm of breast: Secondary | ICD-10-CM | POA: Diagnosis not present

## 2016-12-23 DIAGNOSIS — Z9010 Acquired absence of unspecified breast and nipple: Secondary | ICD-10-CM | POA: Diagnosis not present

## 2016-12-23 DIAGNOSIS — R41841 Cognitive communication deficit: Secondary | ICD-10-CM | POA: Diagnosis not present

## 2016-12-23 DIAGNOSIS — I6200 Nontraumatic subdural hemorrhage, unspecified: Secondary | ICD-10-CM | POA: Diagnosis present

## 2016-12-23 DIAGNOSIS — Z90710 Acquired absence of both cervix and uterus: Secondary | ICD-10-CM | POA: Diagnosis not present

## 2016-12-23 DIAGNOSIS — R1312 Dysphagia, oropharyngeal phase: Secondary | ICD-10-CM | POA: Diagnosis not present

## 2016-12-23 DIAGNOSIS — S065X0A Traumatic subdural hemorrhage without loss of consciousness, initial encounter: Secondary | ICD-10-CM | POA: Diagnosis not present

## 2016-12-23 DIAGNOSIS — E876 Hypokalemia: Secondary | ICD-10-CM | POA: Diagnosis present

## 2016-12-23 DIAGNOSIS — F0390 Unspecified dementia without behavioral disturbance: Secondary | ICD-10-CM | POA: Diagnosis present

## 2016-12-23 DIAGNOSIS — T380X5A Adverse effect of glucocorticoids and synthetic analogues, initial encounter: Secondary | ICD-10-CM | POA: Diagnosis present

## 2016-12-23 DIAGNOSIS — S065XAA Traumatic subdural hemorrhage with loss of consciousness status unknown, initial encounter: Secondary | ICD-10-CM

## 2016-12-23 DIAGNOSIS — S0990XS Unspecified injury of head, sequela: Secondary | ICD-10-CM | POA: Diagnosis not present

## 2016-12-23 DIAGNOSIS — Z8249 Family history of ischemic heart disease and other diseases of the circulatory system: Secondary | ICD-10-CM

## 2016-12-23 DIAGNOSIS — K5900 Constipation, unspecified: Secondary | ICD-10-CM | POA: Diagnosis not present

## 2016-12-23 DIAGNOSIS — D62 Acute posthemorrhagic anemia: Secondary | ICD-10-CM | POA: Diagnosis present

## 2016-12-23 DIAGNOSIS — J9600 Acute respiratory failure, unspecified whether with hypoxia or hypercapnia: Secondary | ICD-10-CM | POA: Diagnosis not present

## 2016-12-23 DIAGNOSIS — M6281 Muscle weakness (generalized): Secondary | ICD-10-CM | POA: Diagnosis not present

## 2016-12-23 DIAGNOSIS — S069X0A Unspecified intracranial injury without loss of consciousness, initial encounter: Secondary | ICD-10-CM | POA: Diagnosis not present

## 2016-12-23 LAB — POTASSIUM: Potassium: 3.8 mmol/L (ref 3.5–5.1)

## 2016-12-23 LAB — COMPREHENSIVE METABOLIC PANEL
ALK PHOS: 41 U/L (ref 38–126)
ALT: 13 U/L — AB (ref 14–54)
AST: 37 U/L (ref 15–41)
Albumin: 3.6 g/dL (ref 3.5–5.0)
Anion gap: 13 (ref 5–15)
BILIRUBIN TOTAL: 1.7 mg/dL — AB (ref 0.3–1.2)
BUN: 35 mg/dL — ABNORMAL HIGH (ref 6–20)
CALCIUM: 9.7 mg/dL (ref 8.9–10.3)
CO2: 26 mmol/L (ref 22–32)
CREATININE: 1.51 mg/dL — AB (ref 0.44–1.00)
Chloride: 101 mmol/L (ref 101–111)
GFR, EST AFRICAN AMERICAN: 35 mL/min — AB (ref >60)
GFR, EST NON AFRICAN AMERICAN: 31 mL/min — AB (ref >60)
Glucose, Bld: 119 mg/dL — ABNORMAL HIGH (ref 65–99)
Potassium: 5.2 mmol/L — ABNORMAL HIGH (ref 3.5–5.1)
Sodium: 140 mmol/L (ref 135–145)
TOTAL PROTEIN: 6.7 g/dL (ref 6.5–8.1)

## 2016-12-23 LAB — CBC
HCT: 34.9 % — ABNORMAL LOW (ref 36.0–46.0)
Hemoglobin: 10.9 g/dL — ABNORMAL LOW (ref 12.0–15.0)
MCH: 26.8 pg (ref 26.0–34.0)
MCHC: 31.2 g/dL (ref 30.0–36.0)
MCV: 85.7 fL (ref 78.0–100.0)
PLATELETS: 125 10*3/uL — AB (ref 150–400)
RBC: 4.07 MIL/uL (ref 3.87–5.11)
RDW: 14.2 % (ref 11.5–15.5)
WBC: 8.1 10*3/uL (ref 4.0–10.5)

## 2016-12-23 LAB — I-STAT CG4 LACTIC ACID, ED: LACTIC ACID, VENOUS: 1.11 mmol/L (ref 0.5–1.9)

## 2016-12-23 MED ORDER — SODIUM CHLORIDE 0.9 % IV BOLUS (SEPSIS)
500.0000 mL | Freq: Once | INTRAVENOUS | Status: AC
  Administered 2016-12-23: 500 mL via INTRAVENOUS

## 2016-12-23 MED ORDER — SODIUM CHLORIDE 0.9 % IV SOLN
INTRAVENOUS | Status: DC
  Administered 2016-12-23 – 2016-12-30 (×7): via INTRAVENOUS

## 2016-12-23 NOTE — H&P (Signed)
[facility] Admission History and Physical Service Pager: [phone]  Patient name: Brittany Christian Medical record number: [id number] Date of birth: 09-Oct-1930 Age: 83 y.o. Gender: female  Primary Care Provider: Madelyn Brunner, MD Consultants: neurosurgery Code Status: FULL per family  Chief Complaint: AMS  Assessment and Plan: [name] is a 83 y.o. female presenting with AMS x 10 days. PMH is significant for HTN.   AMS, Subdural hematoma: Patient with decreased interactions and increased somnolence x 10 days. Family reports that she was found down on 3/18, confused with incontinence of bowel. CT head with 58mm left subacute subdural hematoma with 107mm midline shift to the right, as well as cerebral atrophy. CXR without acute process. Lactic acid normal. Intial K 5.2 but hemolyzed, repeat 3.8. Suspect AMS is secondary to subdural hematoma, CBG WNL, no leukocytosis. At this point, will defer syncope workup for subacute fall 10 days ago. -admit to med surg, Dr. Gwendlyn Deutscher attending -neurosurgery consulted by ED, appreciate recs -NO DVT propylaxis given active bleed -NPO as patient unable to follow commands for bedside swallow eval -continue home verapamil, dosed as IR, not CR for BP control given shift -PT/OT/SLP  -consider syncope workup, such as echo if mental status improves  AKI: Baseline Creatinine 1 year ago 0.7, Cr. On admit 1.5. Patient with limited PO x 10 days, suspect prerenal cause.  -consider PVR -BMP in am after hydration - if not improving in AM, consider renal US, FeUrea to evaluate for other causes  HTN: patient takes verapamil CR at home, will change this dose to IR to enable titration if needed. Desire tight BP control given subdural. Given hydralazine's potential to exacerbate cerebral edema, will use metoprolol sparingly if needed for BP control.  -verapamil 80 mg Q8H  -closely monitor BPs  Goals of care: if no operative  management, would expect patient will not return to functional baseline (ambulatory, living at home with husband, performing all ADLs except finances). Family desires full code at present.  -consider palliative medicine consult pending neurosurgery recs -PT/OT/SLP evals ordered as above, patient may need long term placement  FEN/GI: NPO 2/2 AMS, NS @ 75cc/hr Prophylaxis: NONE given subdural hematoma  Disposition: admit to med surg. D/c pending completion of workup and medical stability. May need SNF placement - will consult SW  History of Present Illness:  Brittany Christian is a 83 y.o. female presenting with AMS x 10 days. Patient unable to answer any orientation questions, but objects to tele monitoring and is vocal. 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 Ten days ago, patient's nephew called stating that she was found on the floor with an episode of fecal incontinence. Son arrived and patient was cleaned up. Notably, patient lives with husband with dementia and nephew. No medical attention sought for fall. Since the fall, son notes decreasing alertness. Initially after the fall was acting herself, but notably less aware the next day. Now sleeping 20/24 hours per day, waking to say minimal words and drink water or Ensure. Patient has been living with son since fall. Patient denies headaches, pain, urinary symptoms to family. No HCPOA, living will.  In ED, CT head with 16mm subacute subdural hematoma and 13 mm midline shift. K initially 5.2, repeat 3.8. Cr 1.5. Given gentle fluids, and family reports increased interaction.   Review Of Systems: Per HPI with the following additions:  patient unable to answer given AMS  ROS  Patient Active Problem List   Diagnosis Date Noted  . Hypertensive urgency 05/10/2016    Past Medical History: Past Medical History:   Diagnosis Date  . Cancer ([place])   . Hypertension    h/o breast cancer in 1997 s/p mastectomy  Past Surgical History: Past Surgical History:  Procedure Laterality Date  . ABDOMINAL HYSTERECTOMY    . MASTECTOMY      Social History: Social History  Substance Use Topics  . Smoking status: Never Smoker  . Smokeless tobacco: Never Used  . Alcohol use No   Additional social history: lives with husband, who is demented, and nephew at baseline. Living with son since fall.   Please also refer to relevant sections of EMR.  Family History: Family History  Problem Relation Age of Onset  . Hypertension Other     Allergies and Medications: No Known Allergies No current facility-administered medications on file prior to encounter.    No current outpatient prescriptions on file prior to encounter.    Objective: BP (!) 144/76   Pulse 76   Temp 97.8 F (36.6 C) (Axillary)   Resp 19   SpO2 98%  Exam: General: Elderly thin female lying in bed in NAD.  Eyes: EOMI, PEERLA\ ENTM: dry mucous membrane, poor denition Neck: supple, no LAD or JVD Cardiovascular: RRR, no murmur Respiratory: CTAB, limited by patient's ability to cooperate with exam Gastrointestinal: SNTND, +BS MSK: moves all extremities spontaneously  Ext: 1+ non-pitting edema in b/l feet and ankles Derm: no rashes or wounds on exposed skin Neuro: patient unable to cooperate with neuro exam. Cannot answer orientation questions, but does vocalize. Moves all 4 extremities, tongue midline, sensation appears intact as she withdrawals from touch to extremities Psych: limited by AMS  Labs and Imaging: CBC BMET   Recent Labs Lab 12/23/16 1536  WBC 8.1  HGB 10.9*  HCT 34.9*  PLT 125*    Recent Labs Lab 12/23/16 1536 12/23/16 2018  NA 140  --   K 5.2* 3.8  CL 101  --   CO2 26  --   BUN 35*  --   CREATININE 1.51*  --   GLUCOSE 119*  --   CALCIUM 9.7  --      EKG  SR with TWI in AVR and V1, no change from  previous Lactic acid 1.11 Urinalysis pending  Dg Chest 1 View  Result Date: 12/23/2016 CLINICAL DATA:  Altered mental status. EXAM: CHEST 1 VIEW COMPARISON:  Chest radiographs 12/13/2013 FINDINGS: Unchanged heart size and mediastinal contours with tortuosity of the thoracic aorta. No pulmonary edema. Linear atelectasis or scarring in the left mid lung. No confluent airspace disease. No evidence of pleural fluid or pneumothorax. Multilevel degenerative change throughout the thoracic spine. IMPRESSION: No acute abnormality or change from prior exam. Electronically Signed   By: Jeb Levering M.D.   On: 12/23/2016 21:12   Ct Head Wo Contrast  Result Date: 12/23/2016 CLINICAL DATA:  Altered mental status EXAM: CT HEAD WITHOUT CONTRAST TECHNIQUE: Contiguous axial images were obtained from the base of the skull through the vertex without intravenous contrast. COMPARISON:  05/10/2016 FINDINGS: Brain: Large left hemispheric subdural hematoma which is predominantly low density. The hematoma measures 19 mm in thickness. There is mass-effect on the left cerebral hemisphere with effacement of the sulci. 13 mm midline shift to the right. Atrophy of the right cerebral hemisphere. Right temporal lobe dilated possibly with some trapping of the  right lateral ventricle. Negative for acute ischemic infarction. Vascular: Negative for hyperdense vessel Skull: Negative for fracture Sinuses/Orbits: Mucosal edema left maxillary sinus.  Negative orbit. Other: None IMPRESSION: 19 mm left subdural hematoma. The fluid is relatively low density suggesting this is a subacute to chronic subdural hematoma. 13 mm midline shift to the right. Cerebral atrophy with progressive dilatation of the right temporal horn possibly due to obstruction of the right lateral ventricle. These results were called by telephone at the time of interpretation on 12/23/2016 at 9:40 pm to Dr. Fredia Sorrow , who verbally acknowledged these results.  Electronically Signed   By: Franchot Gallo M.D.   On: 12/23/2016 21:40   [place] Procedure Unlisted-no Report  Result Date: 12/23/2016 There is no Radiologist interpretation  for this exam.    Sela Hilding, MD 12/23/2016, 10:46 PM PGY-1, [facility] pager: [phone], text pages welcome   Upper Level Addendum:  I have seen and evaluated this patient along with Dr. Lindell Noe and reviewed the above note, making necessary revisions in pink.  Virginia Crews, MD, MPH PGY-3,  [facility] 12/24/2016 6:51 AM

## 2016-12-23 NOTE — ED Provider Notes (Signed)
[facility] DEPT Provider Note   CSN: [id number] Arrival date & time: 12/23/16  1452     History   Chief Complaint Chief Complaint  Patient presents with  . Altered Mental Status    HPI Brittany Christian is a 83 y.o. female.  Patient brought in by family members. For altered mental status for the past 10 days. Patient with poor by mouth intake. No history of any fall. Patient's also had bilateral feet swelling. Patient with some difficulty walking. Less verbal than usual. Patient without complaint of headache or any specific complaints. But family states she did hold her head often.      Past Medical History:  Diagnosis Date  . Cancer ([place])   . Hypertension     Patient Active Problem List   Diagnosis Date Noted  . Hypertensive urgency 05/10/2016    Past Surgical History:  Procedure Laterality Date  . ABDOMINAL HYSTERECTOMY    . MASTECTOMY      OB History    No data available       Home Medications    Prior to Admission medications   Medication Sig Start Date End Date Taking? Authorizing Provider  Ascorbic Acid (VITAMIN C PO) Take 1 tablet by mouth daily.   Yes Historical Provider, MD  verapamil (CALAN-SR) 240 MG CR tablet Take 240 mg by mouth daily. 04/21/16  Yes Historical Provider, MD    Family History Family History  Problem Relation Age of Onset  . Hypertension Other     Social History Social History  Substance Use Topics  . Smoking status: Never Smoker  . Smokeless tobacco: Never Used  . Alcohol use No     Allergies   Patient has no known allergies.   Review of Systems Review of Systems  Unable to perform ROS: Mental status change     Physical Exam Updated Vital Signs BP (!) 144/76   Pulse 74   Temp 97.8 F (36.6 C) (Axillary)   Resp (!) 21   SpO2 99%   Physical Exam  Constitutional: She appears well-developed and well-nourished.  HENT:  Head: Normocephalic and atraumatic.  Mucous membranes dry.  Eyes: Conjunctivae and  EOM are normal. Pupils are equal, round, and reactive to light.  Neck: Normal range of motion. Neck supple.  Cardiovascular: Normal rate, regular rhythm and normal heart sounds.   Pulmonary/Chest: Effort normal and breath sounds normal.  Abdominal: Soft. Bowel sounds are normal. There is no tenderness.  Musculoskeletal: Normal range of motion. She exhibits edema.  Bilateral lower extremity edema mostly in the feet.  Neurological: No sensory deficit. She exhibits normal muscle tone. Coordination normal.  Awake somewhat drowsy  Skin: Skin is warm.  Nursing note and vitals reviewed.    ED Treatments / Results  Labs (all labs ordered are listed, but only abnormal results are displayed) Labs Reviewed  COMPREHENSIVE METABOLIC PANEL - Abnormal; Notable for the following:       Result Value   Potassium 5.2 (*)    Glucose, Bld 119 (*)    BUN 35 (*)    Creatinine, Ser 1.51 (*)    ALT 13 (*)    Total Bilirubin 1.7 (*)    GFR calc non Af Amer 31 (*)    GFR calc Af Amer 35 (*)    All other components within normal limits  CBC - Abnormal; Notable for the following:    Hemoglobin 10.9 (*)    HCT 34.9 (*)    Platelets 125 (*)  All other components within normal limits  POTASSIUM  URINALYSIS, ROUTINE W REFLEX MICROSCOPIC  CBG MONITORING, ED  I-STAT CG4 LACTIC ACID, ED  I-STAT CG4 LACTIC ACID, ED    EKG  EKG Interpretation  Date/Time:  Wednesday December 23 2016 20:01:15 EDT Ventricular Rate:  80 PR Interval:    QRS Duration: 80 QT Interval:  410 QTC Calculation: 473 R Axis:   82 Text Interpretation:  Sinus rhythm Borderline right axis deviation Borderline T abnormalities, anterior leads Baseline wander in lead(s) V4 No significant change since last tracing Confirmed by Erian Rosengren  MD, Cia Garretson [phone]) on 12/23/2016 8:06:29 PM       Radiology Dg Chest 1 View  Result Date: 12/23/2016 CLINICAL DATA:  Altered mental status. EXAM: CHEST 1 VIEW COMPARISON:  Chest radiographs 12/13/2013  FINDINGS: Unchanged heart size and mediastinal contours with tortuosity of the thoracic aorta. No pulmonary edema. Linear atelectasis or scarring in the left mid lung. No confluent airspace disease. No evidence of pleural fluid or pneumothorax. Multilevel degenerative change throughout the thoracic spine. IMPRESSION: No acute abnormality or change from prior exam. Electronically Signed   By: Jeb Levering M.D.   On: 12/23/2016 21:12   Ct Head Wo Contrast  Result Date: 12/23/2016 CLINICAL DATA:  Altered mental status EXAM: CT HEAD WITHOUT CONTRAST TECHNIQUE: Contiguous axial images were obtained from the base of the skull through the vertex without intravenous contrast. COMPARISON:  05/10/2016 FINDINGS: Brain: Large left hemispheric subdural hematoma which is predominantly low density. The hematoma measures 19 mm in thickness. There is mass-effect on the left cerebral hemisphere with effacement of the sulci. 13 mm midline shift to the right. Atrophy of the right cerebral hemisphere. Right temporal lobe dilated possibly with some trapping of the right lateral ventricle. Negative for acute ischemic infarction. Vascular: Negative for hyperdense vessel Skull: Negative for fracture Sinuses/Orbits: Mucosal edema left maxillary sinus.  Negative orbit. Other: None IMPRESSION: 19 mm left subdural hematoma. The fluid is relatively low density suggesting this is a subacute to chronic subdural hematoma. 13 mm midline shift to the right. Cerebral atrophy with progressive dilatation of the right temporal horn possibly due to obstruction of the right lateral ventricle. These results were called by telephone at the time of interpretation on 12/23/2016 at 9:40 pm to Dr. Fredia Sorrow , who verbally acknowledged these results. Electronically Signed   By: Franchot Gallo M.D.   On: 12/23/2016 21:40    Procedures Procedures (including critical care time)  Medications Ordered in ED Medications  0.9 %  sodium chloride  infusion ( Intravenous New Bag/Given 12/23/16 2016)  sodium chloride 0.9 % bolus 500 mL (500 mLs Intravenous New Bag/Given 12/23/16 2016)     Initial Impression / Assessment and Plan / ED Course  I have reviewed the triage vital signs and the nursing notes.  Pertinent labs & imaging results that were available during my care of the patient were reviewed by me and considered in my medical decision making (see chart for details).     Patient with a 10 day history of altered mental status. Very sleepy less talkative than usual little bit difficulty walking. No history of known fall. By mouth intake spent for the past 10 days.  Head CT consistent with subdurals. Which shift. They are subacute radiology estimated to being 14 days ago. May follow her altered mental status concern. Consult out to neurosurgery on called yet. Discussed with the unassigned medicine will be admitted by family medicine. Patient clinically very stable. Patient  improved with IV fluids. BUN and creatinine up by probably due to the lack of by mouth intake for the past few days.  I doubt that neurosurgery would recommend any interventions with a being a subacute subdural and with the patient clinically fairly stable.  Patient's initial potassium was elevated probably hemolysis. Repeat potassium was normal.  Final Clinical Impressions(s) / ED Diagnoses   Final diagnoses:  Altered mental status, unspecified altered mental status type  Subdural hematoma [facility])    New Prescriptions New Prescriptions   No medications on file     Fredia Sorrow, MD 12/23/16 2325

## 2016-12-24 ENCOUNTER — Inpatient Hospital Stay (HOSPITAL_COMMUNITY): Payer: Medicare HMO | Admitting: Certified Registered Nurse Anesthetist

## 2016-12-24 ENCOUNTER — Encounter (HOSPITAL_COMMUNITY): Payer: Self-pay | Admitting: Certified Registered Nurse Anesthetist

## 2016-12-24 ENCOUNTER — Encounter (HOSPITAL_COMMUNITY)
Admission: EM | Disposition: A | Discharge status: Skilled Nursing Facility | Payer: Self-pay | Source: Home / Self Care | Attending: Neurological Surgery

## 2016-12-24 ENCOUNTER — Other Ambulatory Visit (HOSPITAL_COMMUNITY): Payer: Self-pay

## 2016-12-24 ENCOUNTER — Inpatient Hospital Stay (HOSPITAL_COMMUNITY): Payer: Medicare HMO

## 2016-12-24 DIAGNOSIS — R4182 Altered mental status, unspecified: Secondary | ICD-10-CM

## 2016-12-24 DIAGNOSIS — R4189 Other symptoms and signs involving cognitive functions and awareness: Secondary | ICD-10-CM | POA: Diagnosis not present

## 2016-12-24 DIAGNOSIS — R339 Retention of urine, unspecified: Secondary | ICD-10-CM | POA: Diagnosis present

## 2016-12-24 DIAGNOSIS — Z853 Personal history of malignant neoplasm of breast: Secondary | ICD-10-CM | POA: Diagnosis not present

## 2016-12-24 DIAGNOSIS — D62 Acute posthemorrhagic anemia: Secondary | ICD-10-CM | POA: Diagnosis present

## 2016-12-24 DIAGNOSIS — E876 Hypokalemia: Secondary | ICD-10-CM | POA: Diagnosis present

## 2016-12-24 DIAGNOSIS — G9340 Encephalopathy, unspecified: Secondary | ICD-10-CM | POA: Diagnosis present

## 2016-12-24 DIAGNOSIS — R401 Stupor: Secondary | ICD-10-CM | POA: Diagnosis not present

## 2016-12-24 DIAGNOSIS — S065X9A Traumatic subdural hemorrhage with loss of consciousness of unspecified duration, initial encounter: Secondary | ICD-10-CM | POA: Diagnosis present

## 2016-12-24 DIAGNOSIS — S0990XS Unspecified injury of head, sequela: Secondary | ICD-10-CM | POA: Diagnosis not present

## 2016-12-24 DIAGNOSIS — W19XXXA Unspecified fall, initial encounter: Secondary | ICD-10-CM | POA: Diagnosis present

## 2016-12-24 DIAGNOSIS — R262 Difficulty in walking, not elsewhere classified: Secondary | ICD-10-CM | POA: Diagnosis present

## 2016-12-24 DIAGNOSIS — N179 Acute kidney failure, unspecified: Secondary | ICD-10-CM

## 2016-12-24 DIAGNOSIS — T380X5A Adverse effect of glucocorticoids and synthetic analogues, initial encounter: Secondary | ICD-10-CM | POA: Diagnosis present

## 2016-12-24 DIAGNOSIS — Z9010 Acquired absence of unspecified breast and nipple: Secondary | ICD-10-CM | POA: Diagnosis not present

## 2016-12-24 DIAGNOSIS — F0390 Unspecified dementia without behavioral disturbance: Secondary | ICD-10-CM | POA: Diagnosis present

## 2016-12-24 DIAGNOSIS — I6200 Nontraumatic subdural hemorrhage, unspecified: Secondary | ICD-10-CM | POA: Diagnosis present

## 2016-12-24 DIAGNOSIS — Z8249 Family history of ischemic heart disease and other diseases of the circulatory system: Secondary | ICD-10-CM | POA: Diagnosis not present

## 2016-12-24 DIAGNOSIS — Z90710 Acquired absence of both cervix and uterus: Secondary | ICD-10-CM | POA: Diagnosis not present

## 2016-12-24 DIAGNOSIS — J9601 Acute respiratory failure with hypoxia: Secondary | ICD-10-CM | POA: Diagnosis not present

## 2016-12-24 DIAGNOSIS — R471 Dysarthria and anarthria: Secondary | ICD-10-CM | POA: Diagnosis present

## 2016-12-24 DIAGNOSIS — S065XAA Traumatic subdural hemorrhage with loss of consciousness status unknown, initial encounter: Secondary | ICD-10-CM | POA: Diagnosis present

## 2016-12-24 DIAGNOSIS — Z79899 Other long term (current) drug therapy: Secondary | ICD-10-CM | POA: Diagnosis not present

## 2016-12-24 DIAGNOSIS — R739 Hyperglycemia, unspecified: Secondary | ICD-10-CM | POA: Diagnosis not present

## 2016-12-24 DIAGNOSIS — S069X0A Unspecified intracranial injury without loss of consciousness, initial encounter: Secondary | ICD-10-CM | POA: Diagnosis not present

## 2016-12-24 DIAGNOSIS — I160 Hypertensive urgency: Secondary | ICD-10-CM | POA: Diagnosis present

## 2016-12-24 DIAGNOSIS — I10 Essential (primary) hypertension: Secondary | ICD-10-CM | POA: Diagnosis present

## 2016-12-24 HISTORY — PX: CRANIOTOMY: SHX93

## 2016-12-24 LAB — URINALYSIS, ROUTINE W REFLEX MICROSCOPIC
BILIRUBIN URINE: NEGATIVE
Glucose, UA: NEGATIVE mg/dL
Hgb urine dipstick: NEGATIVE
Ketones, ur: NEGATIVE mg/dL
Leukocytes, UA: NEGATIVE
NITRITE: NEGATIVE
PH: 5 (ref 5.0–8.0)
Protein, ur: NEGATIVE mg/dL
SPECIFIC GRAVITY, URINE: 1.015 (ref 1.005–1.030)

## 2016-12-24 LAB — CBC
HCT: 32.3 % — ABNORMAL LOW (ref 36.0–46.0)
HCT: 26.4 % — ABNORMAL LOW (ref 36.0–46.0)
Hemoglobin: 10.4 g/dL — ABNORMAL LOW (ref 12.0–15.0)
Hemoglobin: 8.3 g/dL — ABNORMAL LOW (ref 12.0–15.0)
MCH: 27.5 pg (ref 26.0–34.0)
MCH: 26.9 pg (ref 26.0–34.0)
MCHC: 32.2 g/dL (ref 30.0–36.0)
MCHC: 31.4 g/dL (ref 30.0–36.0)
MCV: 85.4 fL (ref 78.0–100.0)
MCV: 85.4 fL (ref 78.0–100.0)
PLATELETS: 150 10*3/uL (ref 150–400)
Platelets: 156 10*3/uL (ref 150–400)
RBC: 3.78 MIL/uL — ABNORMAL LOW (ref 3.87–5.11)
RBC: 3.09 MIL/uL — ABNORMAL LOW (ref 3.87–5.11)
RDW: 14 % (ref 11.5–15.5)
RDW: 14.1 % (ref 11.5–15.5)
WBC: 5.7 10*3/uL (ref 4.0–10.5)
WBC: 6.7 10*3/uL (ref 4.0–10.5)

## 2016-12-24 LAB — BASIC METABOLIC PANEL
Anion gap: 11 (ref 5–15)
BUN: 33 mg/dL — ABNORMAL HIGH (ref 6–20)
CALCIUM: 9.5 mg/dL (ref 8.9–10.3)
CO2: 28 mmol/L (ref 22–32)
CREATININE: 1.11 mg/dL — AB (ref 0.44–1.00)
Chloride: 103 mmol/L (ref 101–111)
GFR calc non Af Amer: 44 mL/min — ABNORMAL LOW (ref >60)
GFR, EST AFRICAN AMERICAN: 51 mL/min — AB (ref >60)
GLUCOSE: 103 mg/dL — AB (ref 65–99)
Potassium: 3.3 mmol/L — ABNORMAL LOW (ref 3.5–5.1)
Sodium: 142 mmol/L (ref 135–145)

## 2016-12-24 LAB — TYPE AND SCREEN
ABO/RH(D): O POS
ANTIBODY SCREEN: NEGATIVE

## 2016-12-24 LAB — GLUCOSE, CAPILLARY
GLUCOSE-CAPILLARY: 118 mg/dL — AB (ref 65–99)
GLUCOSE-CAPILLARY: 127 mg/dL — AB (ref 65–99)
GLUCOSE-CAPILLARY: 139 mg/dL — AB (ref 65–99)
Glucose-Capillary: 92 mg/dL (ref 65–99)
Glucose-Capillary: 160 mg/dL — ABNORMAL HIGH (ref 65–99)

## 2016-12-24 LAB — ABO/RH: ABO/RH(D): O POS

## 2016-12-24 LAB — I-STAT CG4 LACTIC ACID, ED: Lactic Acid, Venous: 1.1 mmol/L (ref 0.5–1.9)

## 2016-12-24 LAB — PROTIME-INR
INR: 1.19
Prothrombin Time: 15.1 seconds (ref 11.4–15.2)

## 2016-12-24 LAB — MRSA PCR SCREENING: MRSA BY PCR: NEGATIVE

## 2016-12-24 SURGERY — CRANIOTOMY HEMATOMA EVACUATION SUBDURAL
Anesthesia: General | Site: Head | Laterality: Left

## 2016-12-24 MED ORDER — DEXAMETHASONE SODIUM PHOSPHATE 10 MG/ML IJ SOLN
6.0000 mg | Freq: Four times a day (QID) | INTRAMUSCULAR | Status: AC
  Administered 2016-12-24 – 2016-12-25 (×4): 6 mg via INTRAVENOUS
  Filled 2016-12-24 (×4): qty 1

## 2016-12-24 MED ORDER — 0.9 % SODIUM CHLORIDE (POUR BTL) OPTIME
TOPICAL | Status: DC | PRN
  Administered 2016-12-24 (×3): 1000 mL

## 2016-12-24 MED ORDER — SODIUM CHLORIDE 0.9 % IR SOLN
Status: DC | PRN
  Administered 2016-12-24: 500 mL

## 2016-12-24 MED ORDER — HYDROCODONE-ACETAMINOPHEN 5-325 MG PO TABS
1.0000 | ORAL_TABLET | ORAL | Status: DC | PRN
  Administered 2016-12-29 – 2017-01-01 (×4): 1 via ORAL
  Filled 2016-12-24 (×4): qty 1

## 2016-12-24 MED ORDER — SUGAMMADEX SODIUM 200 MG/2ML IV SOLN
INTRAVENOUS | Status: DC | PRN
  Administered 2016-12-24: 150 mg via INTRAVENOUS

## 2016-12-24 MED ORDER — ONDANSETRON HCL 4 MG PO TABS: 4.0000 mg | ORAL_TABLET | ORAL | Status: DC | PRN

## 2016-12-24 MED ORDER — EPHEDRINE SULFATE 50 MG/ML IJ SOLN
INTRAMUSCULAR | Status: DC | PRN
  Administered 2016-12-24: 15 mg via INTRAVENOUS

## 2016-12-24 MED ORDER — PROMETHAZINE HCL 12.5 MG PO TABS
12.5000 mg | ORAL_TABLET | ORAL | Status: DC | PRN
  Filled 2016-12-24: qty 2

## 2016-12-24 MED ORDER — NALOXONE HCL 0.4 MG/ML IJ SOLN: 0.0800 mg | INTRAMUSCULAR | Status: DC | PRN

## 2016-12-24 MED ORDER — FENTANYL CITRATE (PF) 100 MCG/2ML IJ SOLN
INTRAMUSCULAR | Status: DC | PRN
  Administered 2016-12-24 (×2): 100 ug via INTRAVENOUS
  Administered 2016-12-24: 50 ug via INTRAVENOUS

## 2016-12-24 MED ORDER — MIDAZOLAM HCL 2 MG/2ML IJ SOLN
INTRAMUSCULAR | Status: AC
  Filled 2016-12-24: qty 2

## 2016-12-24 MED ORDER — LIDOCAINE-EPINEPHRINE 2 %-1:100000 IJ SOLN
INTRAMUSCULAR | Status: DC | PRN
  Administered 2016-12-24: 15 mL

## 2016-12-24 MED ORDER — THROMBIN 20000 UNITS EX SOLR
CUTANEOUS | Status: DC | PRN
  Administered 2016-12-24: 20 mL via TOPICAL

## 2016-12-24 MED ORDER — LIDOCAINE 2% (20 MG/ML) 5 ML SYRINGE
INTRAMUSCULAR | Status: AC
  Filled 2016-12-24: qty 5

## 2016-12-24 MED ORDER — THROMBIN 5000 UNITS EX SOLR
CUTANEOUS | Status: AC
  Filled 2016-12-24: qty 5000

## 2016-12-24 MED ORDER — ACETAMINOPHEN 650 MG RE SUPP: 650.0000 mg | Freq: Four times a day (QID) | RECTAL | Status: DC | PRN

## 2016-12-24 MED ORDER — FENTANYL CITRATE (PF) 100 MCG/2ML IJ SOLN: 25.0000 ug | INTRAMUSCULAR | Status: DC | PRN

## 2016-12-24 MED ORDER — FLEET ENEMA 7-19 GM/118ML RE ENEM: 1.0000 | ENEMA | Freq: Once | RECTAL | Status: DC | PRN

## 2016-12-24 MED ORDER — DOCUSATE SODIUM 100 MG PO CAPS
100.0000 mg | ORAL_CAPSULE | Freq: Two times a day (BID) | ORAL | Status: DC
  Administered 2016-12-28 – 2017-01-01 (×5): 100 mg via ORAL
  Filled 2016-12-24 (×7): qty 1

## 2016-12-24 MED ORDER — SODIUM CHLORIDE 0.9 % IV SOLN
500.0000 mg | Freq: Two times a day (BID) | INTRAVENOUS | Status: DC
  Administered 2016-12-24 – 2016-12-30 (×13): 500 mg via INTRAVENOUS
  Filled 2016-12-24 (×15): qty 5

## 2016-12-24 MED ORDER — DEXAMETHASONE SODIUM PHOSPHATE 4 MG/ML IJ SOLN
4.0000 mg | Freq: Four times a day (QID) | INTRAMUSCULAR | Status: AC
  Administered 2016-12-25 – 2016-12-26 (×4): 4 mg via INTRAVENOUS
  Filled 2016-12-24 (×4): qty 1

## 2016-12-24 MED ORDER — SODIUM CHLORIDE 0.9 % IV SOLN
0.0125 ug/kg/min | INTRAVENOUS | Status: AC
  Administered 2016-12-24: .2 ug/kg/min via INTRAVENOUS
  Filled 2016-12-24: qty 2000

## 2016-12-24 MED ORDER — BUPIVACAINE HCL (PF) 0.5 % IJ SOLN
INTRAMUSCULAR | Status: AC
  Filled 2016-12-24: qty 30

## 2016-12-24 MED ORDER — ESMOLOL HCL 100 MG/10ML IV SOLN
INTRAVENOUS | Status: DC | PRN
  Administered 2016-12-24 (×2): 50 mg via INTRAVENOUS

## 2016-12-24 MED ORDER — DEXAMETHASONE SODIUM PHOSPHATE 4 MG/ML IJ SOLN
4.0000 mg | Freq: Three times a day (TID) | INTRAMUSCULAR | Status: DC
  Administered 2016-12-26 – 2016-12-27 (×3): 4 mg via INTRAVENOUS
  Filled 2016-12-24 (×3): qty 1

## 2016-12-24 MED ORDER — BACITRACIN ZINC 500 UNIT/GM EX OINT
TOPICAL_OINTMENT | CUTANEOUS | Status: AC
  Filled 2016-12-24: qty 28.35

## 2016-12-24 MED ORDER — SODIUM CHLORIDE 0.9 % IV SOLN
INTRAVENOUS | Status: DC
  Administered 2016-12-24: 14:00:00 via INTRAVENOUS

## 2016-12-24 MED ORDER — VERAPAMIL HCL 80 MG PO TABS
80.0000 mg | ORAL_TABLET | Freq: Three times a day (TID) | ORAL | Status: DC
  Administered 2016-12-28 – 2017-01-01 (×12): 80 mg via ORAL
  Filled 2016-12-24 (×26): qty 1

## 2016-12-24 MED ORDER — ACETAMINOPHEN 325 MG PO TABS: 650.0000 mg | ORAL_TABLET | Freq: Four times a day (QID) | ORAL | Status: DC | PRN

## 2016-12-24 MED ORDER — PROPOFOL 10 MG/ML IV BOLUS
INTRAVENOUS | Status: AC
  Filled 2016-12-24: qty 20

## 2016-12-24 MED ORDER — LABETALOL HCL 5 MG/ML IV SOLN
10.0000 mg | INTRAVENOUS | Status: DC | PRN
  Administered 2016-12-25 (×2): 10 mg via INTRAVENOUS
  Administered 2016-12-26: 20 mg via INTRAVENOUS
  Administered 2016-12-26: 10 mg via INTRAVENOUS
  Administered 2016-12-28 (×2): 40 mg via INTRAVENOUS
  Administered 2016-12-28 (×3): 20 mg via INTRAVENOUS
  Filled 2016-12-24 (×2): qty 4
  Filled 2016-12-24 (×2): qty 8
  Filled 2016-12-24 (×4): qty 4

## 2016-12-24 MED ORDER — INSULIN ASPART 100 UNIT/ML ~~LOC~~ SOLN
0.0000 [IU] | SUBCUTANEOUS | Status: DC
  Administered 2016-12-24: 2 [IU] via SUBCUTANEOUS
  Administered 2016-12-24: 3 [IU] via SUBCUTANEOUS
  Administered 2016-12-25 – 2016-12-27 (×5): 2 [IU] via SUBCUTANEOUS

## 2016-12-24 MED ORDER — CEFAZOLIN SODIUM-DEXTROSE 2-3 GM-% IV SOLR
INTRAVENOUS | Status: DC | PRN
  Administered 2016-12-24: 2 g via INTRAVENOUS

## 2016-12-24 MED ORDER — LIDOCAINE-EPINEPHRINE 2 %-1:100000 IJ SOLN
INTRAMUSCULAR | Status: AC
  Filled 2016-12-24: qty 1

## 2016-12-24 MED ORDER — THROMBIN 5000 UNITS EX SOLR
OROMUCOSAL | Status: DC | PRN
  Administered 2016-12-24: 5 mL via TOPICAL

## 2016-12-24 MED ORDER — ONDANSETRON HCL 4 MG/2ML IJ SOLN: 4.0000 mg | INTRAMUSCULAR | Status: DC | PRN

## 2016-12-24 MED ORDER — HEMOSTATIC AGENTS (NO CHARGE) OPTIME
TOPICAL | Status: DC | PRN
  Administered 2016-12-24: 1 via TOPICAL

## 2016-12-24 MED ORDER — PHENYLEPHRINE HCL 10 MG/ML IJ SOLN
INTRAVENOUS | Status: DC | PRN
  Administered 2016-12-24: 20 ug/min via INTRAVENOUS

## 2016-12-24 MED ORDER — CEFAZOLIN IN D5W 1 GM/50ML IV SOLN
1.0000 g | Freq: Three times a day (TID) | INTRAVENOUS | Status: AC
  Administered 2016-12-24 – 2016-12-25 (×2): 1 g via INTRAVENOUS
  Filled 2016-12-24 (×3): qty 50

## 2016-12-24 MED ORDER — LACTATED RINGERS IV SOLN
INTRAVENOUS | Status: DC | PRN
  Administered 2016-12-24: 10:00:00 via INTRAVENOUS

## 2016-12-24 MED ORDER — LIDOCAINE HCL (CARDIAC) 20 MG/ML IV SOLN
INTRAVENOUS | Status: DC | PRN
  Administered 2016-12-24: 60 mg via INTRAVENOUS

## 2016-12-24 MED ORDER — ROCURONIUM BROMIDE 50 MG/5ML IV SOSY
PREFILLED_SYRINGE | INTRAVENOUS | Status: AC
  Filled 2016-12-24: qty 5

## 2016-12-24 MED ORDER — FENTANYL CITRATE (PF) 250 MCG/5ML IJ SOLN
INTRAMUSCULAR | Status: AC
  Filled 2016-12-24: qty 5

## 2016-12-24 MED ORDER — POLYETHYLENE GLYCOL 3350 17 G PO PACK: 17.0000 g | PACK | Freq: Every day | ORAL | Status: DC | PRN

## 2016-12-24 MED ORDER — HYDROMORPHONE HCL 1 MG/ML IJ SOLN
0.5000 mg | INTRAMUSCULAR | Status: DC | PRN
  Administered 2016-12-24 – 2016-12-26 (×6): 1 mg via INTRAVENOUS
  Administered 2016-12-26 – 2016-12-27 (×2): 0.5 mg via INTRAVENOUS
  Administered 2016-12-28: 1 mg via INTRAVENOUS
  Filled 2016-12-24 (×9): qty 1

## 2016-12-24 MED ORDER — THROMBIN 20000 UNITS EX SOLR
CUTANEOUS | Status: AC
  Filled 2016-12-24: qty 20000

## 2016-12-24 MED ORDER — ACETAMINOPHEN 325 MG PO TABS: 650.0000 mg | ORAL_TABLET | ORAL | Status: DC | PRN

## 2016-12-24 MED ORDER — BUPIVACAINE HCL (PF) 0.5 % IJ SOLN
INTRAMUSCULAR | Status: DC | PRN
  Administered 2016-12-24: 15 mL

## 2016-12-24 MED ORDER — CEFAZOLIN SODIUM-DEXTROSE 2-4 GM/100ML-% IV SOLN
INTRAVENOUS | Status: AC
  Filled 2016-12-24: qty 100

## 2016-12-24 MED ORDER — SODIUM CHLORIDE 0.9 % IV SOLN: INTRAVENOUS | Status: DC | PRN

## 2016-12-24 MED ORDER — ACETAMINOPHEN 650 MG RE SUPP: 650.0000 mg | RECTAL | Status: DC | PRN

## 2016-12-24 MED ORDER — PANTOPRAZOLE SODIUM 40 MG IV SOLR
40.0000 mg | Freq: Every day | INTRAVENOUS | Status: DC
  Administered 2016-12-24 – 2016-12-30 (×7): 40 mg via INTRAVENOUS
  Filled 2016-12-24 (×7): qty 40

## 2016-12-24 MED ORDER — BISACODYL 10 MG RE SUPP: 10.0000 mg | Freq: Every day | RECTAL | Status: DC | PRN

## 2016-12-24 MED ORDER — SODIUM CHLORIDE 0.9 % IV SOLN
30.0000 meq | Freq: Once | INTRAVENOUS | Status: AC
  Administered 2016-12-24: 30 meq via INTRAVENOUS
  Filled 2016-12-24: qty 15

## 2016-12-24 MED ORDER — SODIUM CHLORIDE 0.9 % IV SOLN
1000.0000 mg | INTRAVENOUS | Status: AC
  Administered 2016-12-24: 1000 mg via INTRAVENOUS
  Filled 2016-12-24: qty 10

## 2016-12-24 MED ORDER — ROCURONIUM BROMIDE 100 MG/10ML IV SOLN
INTRAVENOUS | Status: DC | PRN
  Administered 2016-12-24: 30 mg via INTRAVENOUS
  Administered 2016-12-24: 50 mg via INTRAVENOUS

## 2016-12-24 MED ORDER — CEFAZOLIN SODIUM-DEXTROSE 2-4 GM/100ML-% IV SOLN: 2.0000 g | INTRAVENOUS | Status: AC

## 2016-12-24 SURGICAL SUPPLY — 93 items
BATTERY IQ STERILE (MISCELLANEOUS) ×3 IMPLANT
BENZOIN TINCTURE PRP APPL 2/3 (GAUZE/BANDAGES/DRESSINGS) IMPLANT
BLADE CLIPPER SURG (BLADE) ×3 IMPLANT
BLADE ULTRA TIP 2M (BLADE) ×3 IMPLANT
BNDG GAUZE ELAST 4 BULKY (GAUZE/BANDAGES/DRESSINGS) IMPLANT
BUR ACORN 6.0 PRECISION (BURR) ×2 IMPLANT
BUR ACORN 6.0MM PRECISION (BURR) ×1
BUR MATCHSTICK NEURO 3.0 LAGG (BURR) IMPLANT
BUR SPIRAL ROUTER 2.3 (BUR) IMPLANT
BUR SPIRAL ROUTER 2.3MM (BUR)
CANISTER SUCT 3000ML PPV (MISCELLANEOUS) ×3 IMPLANT
CARTRIDGE OIL MAESTRO DRILL (MISCELLANEOUS) ×1 IMPLANT
CATH ROBINSON RED A/P 14FR (CATHETERS) IMPLANT
CHLORAPREP W/TINT 26ML (MISCELLANEOUS) ×3 IMPLANT
CLIP RANEY DISP (INSTRUMENTS) ×3 IMPLANT
CLIP TI MEDIUM 6 (CLIP) IMPLANT
DIFFUSER DRILL AIR PNEUMATIC (MISCELLANEOUS) ×3 IMPLANT
DRAIN JACKSON PRATT 10MM FLAT (MISCELLANEOUS) ×3 IMPLANT
DRAPE NEUROLOGICAL W/INCISE (DRAPES) ×3 IMPLANT
DRAPE SHEET LG 3/4 BI-LAMINATE (DRAPES) ×6 IMPLANT
DRAPE SURG 17X23 STRL (DRAPES) IMPLANT
DRAPE WARM FLUID 44X44 (DRAPE) ×3 IMPLANT
DRSG MEPILEX BORDER 4X8 (GAUZE/BANDAGES/DRESSINGS) ×6 IMPLANT
ELECT REM PT RETURN 9FT ADLT (ELECTROSURGICAL) ×3
ELECTRODE REM PT RTRN 9FT ADLT (ELECTROSURGICAL) ×1 IMPLANT
EVACUATOR 1/8 PVC DRAIN (DRAIN) IMPLANT
EVACUATOR SILICONE 100CC (DRAIN) IMPLANT
GAUZE SPONGE 4X4 12PLY STRL (GAUZE/BANDAGES/DRESSINGS) ×3 IMPLANT
GAUZE SPONGE 4X4 16PLY XRAY LF (GAUZE/BANDAGES/DRESSINGS) IMPLANT
GLOVE BIO SURGEON STRL SZ 6 (GLOVE) ×6 IMPLANT
GLOVE BIO SURGEON STRL SZ7 (GLOVE) ×3 IMPLANT
GLOVE BIOGEL PI IND STRL 6.5 (GLOVE) ×2 IMPLANT
GLOVE BIOGEL PI IND STRL 7.0 (GLOVE) ×1 IMPLANT
GLOVE BIOGEL PI IND STRL 7.5 (GLOVE) ×2 IMPLANT
GLOVE BIOGEL PI INDICATOR 6.5 (GLOVE) ×4
GLOVE BIOGEL PI INDICATOR 7.0 (GLOVE) ×2
GLOVE BIOGEL PI INDICATOR 7.5 (GLOVE) ×4
GLOVE EXAM NITRILE LRG STRL (GLOVE) ×3 IMPLANT
GLOVE EXAM NITRILE XL STR (GLOVE) IMPLANT
GLOVE EXAM NITRILE XS STR PU (GLOVE) IMPLANT
GLOVE SS BIOGEL STRL SZ 7.5 (GLOVE) ×4 IMPLANT
GLOVE SUPERSENSE BIOGEL SZ 7.5 (GLOVE) ×8
GLOVE SURG SS PI 6.5 STRL IVOR (GLOVE) ×9 IMPLANT
GOWN STRL REUS W/ TWL LRG LVL3 (GOWN DISPOSABLE) ×3 IMPLANT
GOWN STRL REUS W/ TWL XL LVL3 (GOWN DISPOSABLE) IMPLANT
GOWN STRL REUS W/TWL 2XL LVL3 (GOWN DISPOSABLE) IMPLANT
GOWN STRL REUS W/TWL LRG LVL3 (GOWN DISPOSABLE) ×6
GOWN STRL REUS W/TWL XL LVL3 (GOWN DISPOSABLE)
HEMOSTAT POWDER KIT SURGIFOAM (HEMOSTASIS) IMPLANT
HEMOSTAT SURGICEL 2X14 (HEMOSTASIS) IMPLANT
HOOK DURA 1/2IN (MISCELLANEOUS) ×3 IMPLANT
KIT BASIN OR (CUSTOM PROCEDURE TRAY) ×3 IMPLANT
KIT ROOM TURNOVER OR (KITS) ×3 IMPLANT
NEEDLE HYPO 21X1.5 SAFETY (NEEDLE) ×3 IMPLANT
NEEDLE HYPO 25X1 1.5 SAFETY (NEEDLE) ×3 IMPLANT
NS IRRIG 1000ML POUR BTL (IV SOLUTION) ×6 IMPLANT
OIL CARTRIDGE MAESTRO DRILL (MISCELLANEOUS) ×3
PACK CRANIOTOMY (CUSTOM PROCEDURE TRAY) ×3 IMPLANT
PATTIES SURGICAL .5 X.5 (GAUZE/BANDAGES/DRESSINGS) IMPLANT
PATTIES SURGICAL .5 X3 (DISPOSABLE) IMPLANT
PATTIES SURGICAL .5X1.5 (GAUZE/BANDAGES/DRESSINGS) IMPLANT
PATTIES SURGICAL 1X1 (DISPOSABLE) IMPLANT
PERFORATOR LRG  14-11MM (BIT) ×2
PERFORATOR LRG 14-11MM (BIT) ×1 IMPLANT
PIN MAYFIELD SKULL DISP (PIN) ×6 IMPLANT
PLATE 1.5  2HOLE LNG NEURO (Plate) ×8 IMPLANT
PLATE 1.5 2HOLE LNG NEURO (Plate) ×4 IMPLANT
SCREW SELF DRILL HT 1.5/4MM (Screw) ×24 IMPLANT
SPONGE LAP 18X18 X RAY DECT (DISPOSABLE) ×3 IMPLANT
SPONGE NEURO XRAY DETECT 1X3 (DISPOSABLE) IMPLANT
SPONGE SURGIFOAM ABS GEL 100 (HEMOSTASIS) ×3 IMPLANT
SPONGE SURGIFOAM ABS GEL 100C (HEMOSTASIS) ×3 IMPLANT
STAPLER VISISTAT 35W (STAPLE) ×6 IMPLANT
STOCKINETTE 6  STRL (DRAPES) ×2
STOCKINETTE 6 STRL (DRAPES) ×1 IMPLANT
STRIP SURGICAL 1 X 6 IN (GAUZE/BANDAGES/DRESSINGS) IMPLANT
STRIP SURGICAL 1/2 X 6 IN (GAUZE/BANDAGES/DRESSINGS) IMPLANT
STRIP SURGICAL 1/4 X 6 IN (GAUZE/BANDAGES/DRESSINGS) IMPLANT
STRIP SURGICAL 3/4 X 6 IN (GAUZE/BANDAGES/DRESSINGS) IMPLANT
SUT ETHILON 3 0 FSL (SUTURE) IMPLANT
SUT ETHILON 3 0 PS 1 (SUTURE) IMPLANT
SUT NURALON 4 0 TR CR/8 (SUTURE) ×6 IMPLANT
SUT VIC AB 0 CT1 18XCR BRD8 (SUTURE) ×2 IMPLANT
SUT VIC AB 0 CT1 8-18 (SUTURE) ×4
SUT VIC AB 2-0 CT1 18 (SUTURE) ×15 IMPLANT
SYR 30ML LL (SYRINGE) ×6 IMPLANT
TOWEL GREEN STERILE (TOWEL DISPOSABLE) ×2 IMPLANT
TOWEL GREEN STERILE FF (TOWEL DISPOSABLE) ×3 IMPLANT
TRAY FOLEY W/METER SILVER 16FR (SET/KITS/TRAYS/PACK) ×3 IMPLANT
TUBE CONNECTING 12'X1/4 (SUCTIONS) ×1
TUBE CONNECTING 12X1/4 (SUCTIONS) ×2 IMPLANT
UNDERPAD 30X30 (UNDERPADS AND DIAPERS) ×3 IMPLANT
WATER STERILE IRR 1000ML POUR (IV SOLUTION) ×3 IMPLANT

## 2016-12-24 NOTE — Transfer of Care (Signed)
Immediate Anesthesia Transfer of Care Note  Patient: Brittany Christian  Procedure(s) Performed: Procedure(s): CRANIOTOMY HEMATOMA EVACUATION SUBDURAL (Left)  Patient Location: PACU  Anesthesia Type:General  Level of Consciousness: awake and alert   Airway & Oxygen Therapy: Patient Spontanous Breathing and Patient connected to nasal cannula oxygen  Post-op Assessment: Report given to RN and Post -op Vital signs reviewed and stable  Post vital signs: Reviewed and stable  Last Vitals:  Vitals:   12/24/16 0300 12/24/16 0400  BP: (!) 108/59 (!) 146/80  Pulse: 80 77  Resp: 10 16  Temp:  36.6 C    Last Pain:  Vitals:   12/24/16 0400  TempSrc: Axillary         Complications: No apparent anesthesia complications

## 2016-12-24 NOTE — Progress Notes (Signed)
SLP Cancellation Note  Patient Details Name: Brittany Christian MRN: [id number] DOB: [birth year]   Cancelled treatment: Bedside swallow evaluation not completed as patient is going to surgery today. ST will continue efforts.            [name] , [place] 12/24/2016, 8:52 AM

## 2016-12-24 NOTE — Op Note (Signed)
12/23/2016 - 12/24/2016  12:22 PM  PATIENT:  Brittany Christian  83 y.o. female  PRE-OPERATIVE DIAGNOSIS:  Left convexity subdural hematoma  POST-OPERATIVE DIAGNOSIS:  Same  PROCEDURE:  Left frontotemporopareital craniotomy for evacuation of subdural hematoma  SURGEON:  Aldean Ast, MD  ASSISTANTS: Ferne Reus, PA-C  ANESTHESIA:   General  DRAINS: None   SPECIMEN:  None  INDICATION FOR PROCEDURE: 83 year old woman with a large left convexity subacute subdural hematoma and altered mental status.  I recommended the above operation. The patient's famikly understood the risks, benefits, and alternatives and potential outcomes and wished to proceed.  PROCEDURE DETAILS: After smooth induction of general endotracheal anesthesia the patient was position on the OR table with the head fixated in [place] pins and turned to the right. The leftfrontotemporoparietal scalp was clipped of hair and wiped out with alcohol. A question mark shaped skin incision was planned. This area was anesthetized with lidocaine and Marcaine with epinephrine. The patient was prepped and draped in the usual sterile fashion.  The skin was incised sharply down to the pericranium above the temporalis. It was opened sharply to the level of the temporalis. The temporalis fascia was then sharply incised and the muscle was incised down to bone. Raney clips were applied to skin edges. A musculocutaneous flap was advanced anteriorly and inferiorly. 4 bur holes were drilled and a craniotomy was performed. The craniotomy flap was elevated with a periosteal elevator. The dura was opened sharply. There was chronic, organized hematoma as well as thick dark fluid under pressure. The organized hematoma was irrigated away. All loculations were opened and drained of fluid. There was a membraneon the surface of brain which was elevated and resected.There was excellent hemostasis. I irrigated in all directions until I was  satisfied that there was only clear fluid returning. A 10 flat JP drain was placed in the subdural space and tunneled through the skin and secured with a purse string suture.  Dura was closed with interrupted Vicryl sutures. I irrigated again. Multipletack up sutures wereplaced within the dura. The craniotomy flap was positioned and secured with titanium plates. The tack up sutures were passed through and secured. I irrigated again with bacitracin saline. The temporalis fascia was closed with interrupted Vicryl sutures. The galea was then closed with interrupted Vicryl sutures. The skin was closed with staples. The patient was removed from Mayfield pins and a sterile dressing was applied. The drain was found to be leaking and not holding charge.  There was no obvious hole but because the leaking could not be stopped the drain was pulled and staples applied to seal the exit hole.  PATIENT DISPOSITION:  ICU - extubated and stable.   Delay start of Pharmacological VTE agent (>24hrs) due to surgical blood loss or risk of bleeding:  yes

## 2016-12-24 NOTE — Consult Note (Signed)
CC:  Chief Complaint  Patient presents with  . Altered Mental Status    HPI: Brittany Christian is a 83 y.o. female who was brought to the ER by her family for altered mental status. Present with son and nursing in room who give history. Patient was found sitting on bedroom floor 3/19 disoriented, incontinent of urine and has been progressively getting worse. There was no witnessed falls or signs of a fall like scalp hematomas, abrasions. 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 She is not communicative with me, but will mumble to her son for me to "get out of the bedroom". No seizure like activity reported. She is currently NPO at this time.   PMH: Past Medical History:  Diagnosis Date  . Cancer ([place])   . Hypertension     PSH: Past Surgical History:  Procedure Laterality Date  . ABDOMINAL HYSTERECTOMY    . MASTECTOMY      SH: Social History  Substance Use Topics  . Smoking status: Never Smoker  . Smokeless tobacco: Never Used  . Alcohol use No    MEDS: Prior to Admission medications   Medication Sig Start Date End Date Taking? Authorizing Provider  Ascorbic Acid (VITAMIN C PO) Take 1 tablet by mouth daily.   Yes Historical Provider, MD  verapamil (CALAN-SR) 240 MG CR tablet Take 240 mg by mouth daily. 04/21/16  Yes Historical Provider, MD    ALLERGY: No Known Allergies  ROS: Unable to obtain based on mental status ROS  Vitals:   12/24/16 0300 12/24/16 0400  BP: (!) 108/59 (!) 146/80  Pulse: 80 77  Resp: 10 16  Temp:  97.8 F (36.6 C)   General appearance: Alert and awake but confused. Unable to answer questions.  Does not follow my commands, just reaches for son with left hand.  She does move all extremities.  Unable to check strength. Does withdraw to painful stimuli Eyes: PERRL Cardiovascular: Regular rate and rhythm without murmurs, rubs, gallops. No edema or variciosities. Distal pulses  normal.  IMAGING: IMPRESSION: 19 mm left subdural hematoma. The fluid is relatively low density suggesting this is a subacute to chronic subdural hematoma. 13 mm midline shift to the right.  Cerebral atrophy with progressive dilatation of the right temporal horn possibly due to obstruction of the right lateral ventricle.  IMPRESSION/PLAN: - 83 y.o. female with left subdural hematoma. She is confused and does not follow commands. Stat repeat CT head w/o. Start on Amo. Case discussed with Dr Cyndy Freeze.   Addendum Reviewed repeat CT.  IMPRESSION: No appreciable change in size and configuration of large left-sided subdural hematoma. The degree of mass effect is stable as is the degree of midline shift toward the right, measured at 13 mm. The degree of ventricular effacement on the left and ex vacuo phenomenon on the right appear stable. There is concern for early infarct development in the right parietal lobe. This area is compromise with motion artifact making evaluation significantly less than optimal. Particular attention this area on subsequent evaluations is warranted. No intra-axial hemorrhage noted.  No new extra-axial fluid. Areas of arterial vascular calcification as well as paranasal sinus disease as summarized above.  Discussed with attending of results for possible early infarct. Will consult neurology.  Discussed scan with Dr Cyndy Freeze. She is scheduled for left craniotomy for evacuation of SDH at 1000. Risks and benefits were discussed at length including risks of anesthesia. Her son, in  his own language, states understanding of the procedure and wishes to proceed. All questions were sought and answered.

## 2016-12-24 NOTE — Anesthesia Preprocedure Evaluation (Addendum)
Anesthesia Evaluation  Patient identified by MRN, date of birth, ID band Patient confused    Reviewed: Allergy & Precautions, H&P , NPO status , Patient's Chart, lab work & pertinent test results  Airway Mallampati: II  TM Distance: >3 FB Neck ROM: Full    Dental no notable dental hx. (+) Edentulous Upper, Edentulous Lower, Dental Advisory Given   Pulmonary neg pulmonary ROS,    Pulmonary exam normal breath sounds clear to auscultation       Cardiovascular hypertension, Pt. on medications  Rhythm:Regular Rate:Normal     Neuro/Psych negative neurological ROS  negative psych ROS   GI/Hepatic negative GI ROS, Neg liver ROS,   Endo/Other  negative endocrine ROS  Renal/GU negative Renal ROS  negative genitourinary   Musculoskeletal   Abdominal   Peds  Hematology negative hematology ROS (+)   Anesthesia Other Findings   Reproductive/Obstetrics negative OB ROS                            Anesthesia Physical Anesthesia Plan  ASA: II  Anesthesia Plan: General   Post-op Pain Management:    Induction: Intravenous  Airway Management Planned: Oral ETT  Additional Equipment: Arterial line  Intra-op Plan:   Post-operative Plan: Extubation in OR and Possible Post-op intubation/ventilation  Informed Consent: I have reviewed the patients History and Physical, chart, labs and discussed the procedure including the risks, benefits and alternatives for the proposed anesthesia with the patient or authorized representative who has indicated his/her understanding and acceptance.   Dental advisory given  Plan Discussed with: CRNA  Anesthesia Plan Comments:         Anesthesia Quick Evaluation

## 2016-12-24 NOTE — Brief Op Note (Cosign Needed)
12/23/2016 - 12/24/2016  12:15 PM  PATIENT:  Brittany Christian  83 y.o. female  PRE-OPERATIVE DIAGNOSIS:  Subdural hematoma  POST-OPERATIVE DIAGNOSIS:  Subdural hematoma  PROCEDURE:  Procedure(s): CRANIOTOMY HEMATOMA EVACUATION SUBDURAL (Left)  SURGEON:  Surgeon(s) and Role:    * Kevan Ny Ditty, MD - Primary  PHYSICIAN ASSISTANT: Ferne Reus, PA-C  ANESTHESIA:   general  EBL:  Total I/O In: 1600 [I.V.:1600] Out: 685 [Urine:535; Blood:150]  BLOOD ADMINISTERED:none  DRAINS: none   LOCAL MEDICATIONS USED:  MARCAINE     SPECIMEN:  No Specimen  DISPOSITION OF SPECIMEN:  N/A  COUNTS:  YES  TOURNIQUET:  * No tourniquets in log *  DICTATION: .Note written in EPIC  PLAN OF CARE: Admit to inpatient   PATIENT DISPOSITION:  PACU - hemodynamically stable.   Delay start of Pharmacological VTE agent (>24hrs) due to surgical blood loss or risk of bleeding: yes

## 2016-12-24 NOTE — Progress Notes (Signed)
PT Cancellation Note  Patient Details Name: Brittany Christian MRN: [id number] DOB: Oct 21, 1930   Cancelled Treatment:    Reason Eval/Treat Not Completed: Patient at procedure or test/unavailable; patient in OR for craniotomy.  Will attempt another day.   Reginia Naas 12/24/2016, 10:22 AM Magda Kiel, [place] 12/24/2016

## 2016-12-24 NOTE — Progress Notes (Signed)
MD notified of lab results and patients NPO status. RN will continue to monitor patient.

## 2016-12-24 NOTE — Progress Notes (Signed)
Patient ambulated from bed to [facility] with some difficulty. Patient will follow simple commands. Patient demonstrates left side gaze preffrence and requires assistance/prompting with moving LLE. Patient will verbalize urgency to urinate but unable to empty bladder when sitting on BSC. Oncoming RN informed of no urination since in/out urinary catheterization PTA to unit. RN will continue to monitor I/O.

## 2016-12-24 NOTE — Consult Note (Signed)
PULMONARY / CRITICAL CARE MEDICINE   Name: Brittany Christian MRN: [id number] DOB: May 13, 1931    ADMISSION DATE:  12/23/2016 CONSULTATION DATE:  12/24/16  REFERRING MD:  Dr. Cyndy Freeze   CHIEF COMPLAINT:  S/P Left Craniotomy for Evacuation of Hematoma   HISTORY OF PRESENT ILLNESS:  83 y/o F who presented 3/28 to [facility] with a 10 day history of altered mental status, poor PO intake, difficulty walking and lower extremity swelling.    On admit, family reported they found her on the floor incontinent but it was an unwitnessed event.  Prior to episode, the patient was reportedly able to independently perform ADL's.  CT of the head was assessed which demonstrated a 19 mm left subdural hematoma thought to be subacute to chronic with a 13 mm midline right shift, cerebral atrophy with progressive dilation of the right temporal horn possibly due to obstruction of the right lateral ventricle. She was admitted by FPTS and evaluated by Neurosurgery.  The patient underwent left craniotomy on 3/28 per Dr. Cyndy Freeze for evacuation of subdural hematoma.  She was extubated and returned to ICU.    PCCM consulted for medical management in ICU.   PAST MEDICAL HISTORY :  She  has a past medical history of Cancer ([place]) and Hypertension.  PAST SURGICAL HISTORY: She  has a past surgical history that includes Mastectomy and Abdominal hysterectomy.  No Known Allergies  No current facility-administered medications on file prior to encounter.    No current outpatient prescriptions on file prior to encounter.    FAMILY HISTORY:  Her indicated that the status of her other is unknown.    SOCIAL HISTORY: She  reports that she has never smoked. She has never used smokeless tobacco. She reports that she does not drink alcohol or use drugs.  REVIEW OF SYSTEMS:  Patient post-operative / sedate  SUBJECTIVE:    VITAL SIGNS: BP (!) 154/128   Pulse 91   Temp 97.2 F (36.2 C)   Resp 20   Ht 5\' 8"  (1.727 m)   Wt 124 lb 12.5  oz (56.6 kg)   SpO2 99%   BMI 18.97 kg/m   HEMODYNAMICS:    VENTILATOR SETTINGS:    INTAKE / OUTPUT: I/O last 3 completed shifts: In: 547.5 [I.V.:547.5] Out: -   PHYSICAL EXAMINATION: General:  Elderly female in NAD, lying in bed Neuro:  Head wrapped, staples left temporal noted under dressing HEENT:  MM pink / moist, no jvd  Cardiovascular:  s1s2 rrr, no m/r/g  Lungs:  Even/non-labored, lungs bilaterally clear  Abdomen:  Soft, bsx4 active  Musculoskeletal:  No acute deformities  Skin:  Warm/dry, no edema   LABS:  BMET  Recent Labs Lab 12/23/16 1536 12/23/16 2018 12/24/16 0522  NA 140  --  142  K 5.2* 3.8 3.3*  CL 101  --  103  CO2 26  --  28  BUN 35*  --  33*  CREATININE 1.51*  --  1.11*  GLUCOSE 119*  --  103*    Electrolytes  Recent Labs Lab 12/23/16 1536 12/24/16 0522  CALCIUM 9.7 9.5    CBC  Recent Labs Lab 12/23/16 1536 12/24/16 0522 12/24/16 1338  WBC 8.1 5.7 6.7  HGB 10.9* 10.4* 8.3*  HCT 34.9* 32.3* 26.4*  PLT 125* 150 156    Coag's No results for input(s): APTT, INR in the last 168 hours.  Sepsis Markers  Recent Labs Lab 12/23/16 1552 12/23/16 2024  LATICACIDVEN 1.11 1.10  ABG No results for input(s): PHART, PCO2ART, PO2ART in the last 168 hours.  Liver Enzymes  Recent Labs Lab 12/23/16 1536  AST 37  ALT 13*  ALKPHOS 41  BILITOT 1.7*  ALBUMIN 3.6    Cardiac Enzymes No results for input(s): TROPONINI, PROBNP in the last 168 hours.  Glucose  Recent Labs Lab 12/24/16 0536 12/24/16 0742  GLUCAP 118* 92    Imaging Dg Chest 1 View  Result Date: 12/23/2016 CLINICAL DATA:  Altered mental status. EXAM: CHEST 1 VIEW COMPARISON:  Chest radiographs 12/13/2013 FINDINGS: Unchanged heart size and mediastinal contours with tortuosity of the thoracic aorta. No pulmonary edema. Linear atelectasis or scarring in the left mid lung. No confluent airspace disease. No evidence of pleural fluid or pneumothorax. Multilevel  degenerative change throughout the thoracic spine. IMPRESSION: No acute abnormality or change from prior exam. Electronically Signed   By: Jeb Levering M.D.   On: 12/23/2016 21:12   Ct Head Wo Contrast  Result Date: 12/24/2016 CLINICAL DATA:  Altered mental status with known subdural hematoma and mass effect. EXAM: CT HEAD WITHOUT CONTRAST TECHNIQUE: Contiguous axial images were obtained from the base of the skull through the vertex without intravenous contrast. COMPARISON:  December 23, 2016 FINDINGS: Brain: There is extensive motion artifact which significantly limits assessment in resolution on this study. The previously noted left-sided subdural hematoma is again noted, with primarily low attenuation fluid but a somewhat increased attenuation periphery, is stable. It again is causing significant mass effect on the left supratentorial region. There is again noted 13 mm of midline shift toward the right with effacement of the left lateral ventricle and enlargement of the right lateral ventricle on an ex vacuo basis. There is relative effacement of the third ventricle. The fourth ventricle is midline and unremarkable. There is no new mass effect or new extra-axial fluid. No intra-axial hemorrhage is evident. There is questionable new decreased attenuation in the right parietal region which may represent early infarct development in this area. Motion artifact makes assessment of this area difficulty less than optimal. No other findings concerning for acute infarct are evident. Vascular: No hyperdense vessels are evident. There is calcification in each carotid siphon region. Skull: The bony calvarium appears intact on this study which is somewhat compromised by motion artifact. Sinuses/Orbits: There is mucosal thickening throughout the left maxillary antrum with probable polypoid change. There is mucosal thickening and opacification in several ethmoid air cells bilaterally. Visualized paranasal sinuses elsewhere  clear. No intraorbital lesions are evident. Other: Mastoid air cells are clear. IMPRESSION: No appreciable change in size and configuration of large left-sided subdural hematoma. The degree of mass effect is stable as is the degree of midline shift toward the right, measured at 13 mm. The degree of ventricular effacement on the left and ex vacuo phenomenon on the right appear stable. There is concern for early infarct development in the right parietal lobe. This area is compromise with motion artifact making evaluation significantly less than optimal. Particular attention this area on subsequent evaluations is warranted. No intra-axial hemorrhage noted.  No new extra-axial fluid. Areas of arterial vascular calcification as well as paranasal sinus disease as summarized above. These results will be called to the ordering clinician or representative by the Radiologist Assistant, and communication documented in the PACS or zVision Dashboard. Electronically Signed   By: Lowella Grip III M.D.   On: 12/24/2016 08:47   Ct Head Wo Contrast  Result Date: 12/23/2016 CLINICAL DATA:  Altered mental status EXAM:  CT HEAD WITHOUT CONTRAST TECHNIQUE: Contiguous axial images were obtained from the base of the skull through the vertex without intravenous contrast. COMPARISON:  05/10/2016 FINDINGS: Brain: Large left hemispheric subdural hematoma which is predominantly low density. The hematoma measures 19 mm in thickness. There is mass-effect on the left cerebral hemisphere with effacement of the sulci. 13 mm midline shift to the right. Atrophy of the right cerebral hemisphere. Right temporal lobe dilated possibly with some trapping of the right lateral ventricle. Negative for acute ischemic infarction. Vascular: Negative for hyperdense vessel Skull: Negative for fracture Sinuses/Orbits: Mucosal edema left maxillary sinus.  Negative orbit. Other: None IMPRESSION: 19 mm left subdural hematoma. The fluid is relatively low  density suggesting this is a subacute to chronic subdural hematoma. 13 mm midline shift to the right. Cerebral atrophy with progressive dilatation of the right temporal horn possibly due to obstruction of the right lateral ventricle. These results were called by telephone at the time of interpretation on 12/23/2016 at 9:40 pm to Dr. Fredia Sorrow , who verbally acknowledged these results. Electronically Signed   By: Franchot Gallo M.D.   On: 12/23/2016 21:40   [place] Procedure Unlisted-no Report  Result Date: 12/23/2016 There is no Radiologist interpretation  for this exam.    STUDIES:  CT Head 3/29 >> 19 mm left subdural hematoma thought to be subacute to chronic with a 13 mm midline right shift, cerebral atrophy with progressive dilation of the right temporal horn possibly due to obstruction of the right lateral ventricle.  CT Head 3/29 >> no appreciable change in size of large left sided subdural hematoma, midline shift 46mm ECHO 3/29 >>  CT Head 3/30 >>   CULTURES:   ANTIBIOTICS: Ancef 3/29 >>   SIGNIFICANT EVENTS: 3/28  Admit with AMS, found to have a SDH  3/29  OR for L crani / evacuation of hematoma   LINES/TUBES:   DISCUSSION: 83 y/o F admitted 3/28 with AMS.  Found to have L 38mm SDH.  To OR 3/29 am for evacuation of hematoma.    ASSESSMENT / PLAN:  NEUROLOGIC A:   Left Subdural Hematoma - suspect subacute, see CT above  Acute Encephalopathy - secondary to SDH with midline shift  ? Baseline Dementia - reported as "mild" P:   RASS goal: n/a Frequent neuro exams  Minimize sedating medications as able Post-operative care per NSGY  Continue decadron  Keppra Q12 for 7 days PRN percocet / dilaudid for pain   PULMONARY A: At Risk Aspiration in setting of AMS  P:   Aspiration precautions  NPO until SLP evaluation  Intermittent CXR  CARDIOVASCULAR A:  Hypertension  P:  Tele monitoring in ICU  PRN Labetalol  Hold home verapamil until swallowing can be assessed    RENAL A:   Hypokalemia  AKI - resolved with IVF's P:   Trend BMP / urinary output Replace electrolytes as indicated Avoid nephrotoxic agents, ensure adequate renal perfusion  GASTROINTESTINAL A:   At Risk Aspiration  At Risk Constipation  P:   NPO until SLP evaluation  PPI  Bowel regimen as ordered   HEMATOLOGIC A:   Anemia - new this admit, suspect in setting of recent blood loss / SDH P:  Monitor CBC Transfuse for Hgb <7%  INFECTIOUS A:   Post-operative Craniotomy  P:   Ancef post op per NSGY  Monitor fever curve / WBC   ENDOCRINE A:   Hyperglycemia - mild  P:   Monitor glucose with decadron  Add SSI while on steroids     FAMILY  - Updates: No family available at present.    - Inter-disciplinary family meet or Palliative Care meeting due by:  4/5   NP CC Time: 57 minutes   Noe Gens, NP-C [facility] Pgr: [phone] or if no answer [phone] 12/24/2016, 2:11 PM  Attending Note:  83 year old female s/p crani for large ICH with midline shift.  Patient was taken to the OR and hematoma was evacuated.  On exam, she is lethargic but arousable, slow to follow commands.  I reviewed CT myself, ICH and 13 mm shift noted.  Patient is currently extubated.  Airway protection is questionable however.  Maintenance IVF as ordered.  Will need SLP.  Continue Keppra and decadron.  Dilaudid and percocet for pain but gingerly to avoid sedation.  BMET in AM and replace electrolytes as needed.  Hold in ICU for airway protection concerns, may require intubation overnight.  Monitor closely.  The patient is critically ill with multiple organ systems failure and requires high complexity decision making for assessment and support, frequent evaluation and titration of therapies, application of advanced monitoring technologies and extensive interpretation of multiple databases.   Critical Care Time devoted to patient care services described in this note is  35   Minutes. This time reflects time of care of this signee Dr Jennet Maduro. This critical care time does not reflect procedure time, or teaching time or supervisory time of PA/NP/Med student/Med Resident etc but could involve care discussion time.  Rush Farmer, M.D. [facility] Pulmonary/Critical Care Medicine. Pager: [phone]. After hours pager: [phone].

## 2016-12-24 NOTE — Progress Notes (Signed)
Consulting provider with family medicine paged and responded. MD informed of patients family report. No stroke work up at this time. RN will continue to monitor patient.

## 2016-12-24 NOTE — Anesthesia Postprocedure Evaluation (Signed)
Anesthesia Post Note  Patient: [name]  Procedure(s) Performed: Procedure(s) (LRB): CRANIOTOMY HEMATOMA EVACUATION SUBDURAL (Left)  Patient location during evaluation: PACU Anesthesia Type: General Level of consciousness: awake and alert Pain management: pain level controlled Vital Signs Assessment: post-procedure vital signs reviewed and stable Respiratory status: spontaneous breathing, nonlabored ventilation, respiratory function stable and patient connected to nasal cannula oxygen Cardiovascular status: blood pressure returned to baseline and stable Postop Assessment: no signs of nausea or vomiting Anesthetic complications: no       Last Vitals:  Vitals:   12/24/16 1240 12/24/16 1245  BP:    Pulse: 93 91  Resp: 17 20  Temp:      Last Pain:  Vitals:   12/24/16 0400  TempSrc: Axillary                 Marcianne Ozbun,W. EDMOND

## 2016-12-24 NOTE — Anesthesia Procedure Notes (Signed)
Procedure Name: Intubation Date/Time: 12/24/2016 10:09 AM Performed by: Salli Quarry Fate Galanti Pre-anesthesia Checklist: Patient identified, Emergency Drugs available, Suction available and Patient being monitored Patient Re-evaluated:Patient Re-evaluated prior to inductionOxygen Delivery Method: Circle System Utilized Preoxygenation: Pre-oxygenation with 100% oxygen Intubation Type: IV induction Ventilation: Mask ventilation without difficulty Laryngoscope Size: Mac and 3 Grade View: Grade I Tube type: Oral Tube size: 7.0 mm Number of attempts: 1 Airway Equipment and Method: Stylet Placement Confirmation: ETT inserted through vocal cords under direct vision,  positive ETCO2 and breath sounds checked- equal and bilateral Secured at: 20 cm Tube secured with: Tape Dental Injury: Teeth and Oropharynx as per pre-operative assessment

## 2016-12-24 NOTE — Progress Notes (Signed)
Patient arrived to unit alert and oriented to self. Patient able to follow simple commands and form sentences when communicating verbally. Patient gown and linens changed. Current treatment plan, medications, and medical history reviewed and confirmed with patient son. Patient family reports that patient is from home and prior to event on 12/13/16 was main caregiver for patients husband. Patient preformed all her ADLs independently with no difficulties. Patients son reports minor short term memory changes "but nothing like this". Patient son states patient was found sitting on floor on 12/13/16, no signs of injury to head or body at time of event. Patients change in mental status did not begin until 12/14/16 and gradually worsened. Patients PMH of HTN which was recently diagnosed by PCP. Family medicine oncall paged and informed. RN will continue to monitor.

## 2016-12-24 NOTE — Progress Notes (Signed)
CSW consult acknowledged. 83 YO Female with subacute subdural hematoma w/ midline shift. Per MD, d/c pending completion of workup and medical stability. May need SNF placement. CSW now following.    Lorrine Kin, MSW, LCSW [facility] ED/[facility] Clinical Social Worker [phone]

## 2016-12-24 NOTE — Progress Notes (Signed)
Family Medicine Teaching Service Daily Progress Note Intern Pager: [phone]  Patient name: Brittany Christian Medical record number: [id number] Date of birth: June 16, 1931 Age: 83 y.o. Gender: female  Primary Care Provider: Madelyn Brunner, MD Consultants: neurosurgery  Code Status: FULL  Pt Overview and Major Events to Date:  3/28 presenting 10 days after a fall with subacute subdural hematoma with midline shift  Assessment and Plan: Brittany Christian is a 83 y.o. female presenting with AMS x 10 days. PMH is significant for HTN.   AMS, Subdural hematoma: Patient with decreased interactions and increased somnolence x 10 days. Family reports that she was found down on 3/18, confused with incontinence of bowel. CT head with 25mm left subacute subdural hematoma with 48mm midline shift to the right, as well as cerebral atrophy. CXR without acute process. Lactic acid normal. Intial K 5.2 but hemolyzed, repeat 3.8. Suspect AMS is secondary to subdural hematoma, CBG WNL, no leukocytosis. At this point, will defer syncope workup for subacute fall 10 days ago. -neurosurgery consulted by ED, appreciate recs, reconsulted this am -NO DVT propylaxis given active bleed -NPO as patient unable to follow commands for bedside swallow eval -continue home verapamil, dosed as IR, not CR for BP control given shift -PT/OT/SLP  -echo ordered -CT head with possible stroke, although significant motion artifact. Discussed with neurology, who recommended MRI. They recommended reconsult if continued concern for stroke.   AKI, improving: Baseline Creatinine 1 year ago 0.7, Cr. On admit 1.5 > 1.1. Patient with limited PO x 10 days, suspect prerenal cause.  -consider PVR -BMP in am after hydration - if not improving in AM, consider renal US, FeUrea to evaluate for other causes  HTN: patient takes verapamil CR at home, will change this dose to IR to enable titration if needed. Desire tight BP control given subdural.  Given hydralazine's potential to exacerbate cerebral edema, will use metoprolol sparingly if needed for BP control.  -verapamil 80 mg Q8H  -closely monitor BPs  Goals of care: if no operative management, would expect patient will not return to functional baseline (ambulatory, living at home with husband, performing all ADLs except finances). Family desires full code at present.  -consider palliative medicine consult pending neurosurgery recs -PT/OT/SLP evals ordered as above, patient may need long term placement  FEN/GI: NPO 2/2 AMS, NS @ 75cc/hr Prophylaxis: NONE given subdural hematoma  Disposition: continued inpatient management of subdural hematoma  Subjective:  Patient with continued AMS this morning, denies pain. Family had extensive discussion with neurosurgery PA, and they really appreciate their care.   Objective: Temp:  [97.8 F (36.6 C)] 97.8 F (36.6 C) (03/29 0400) Pulse Rate:  [72-84] 77 (03/29 0400) Resp:  [0-21] 16 (03/29 0400) BP: (108-146)/(59-84) 146/80 (03/29 0400) SpO2:  [96 %-100 %] 100 % (03/29 0400) Weight:  [124 lb 12.5 oz (56.6 kg)] 124 lb 12.5 oz (56.6 kg) (03/29 0400) Physical Exam: General: Elderly thin female lying in bed in NAD.  Neck: supple, no LAD or JVD  Cardiovascular: RRR, no murmur Respiratory: CTAB, limited by patient's ability to cooperate with exam Gastrointestinal: SNTND, +BS Ext: 1+ non-pitting edema in b/l feet and ankles Neuro: patient unable to cooperate with neuro exam. Cannot answer orientation questions, but does vocalize. Moves all 4 extremities, tongue midline, sensation appears intact as she withdrawals from touch to extremities  Laboratory:  Recent Labs Lab 12/23/16 1536 12/24/16 0522  WBC 8.1 5.7  HGB 10.9* 10.4*  HCT 34.9* 32.3*  PLT 125*  150    Recent Labs Lab 12/23/16 1536 12/23/16 2018 12/24/16 0522  NA 140  --  142  K 5.2* 3.8 3.3*  CL 101  --  103  CO2 26  --  28  BUN 35*  --  33*  CREATININE 1.51*   --  1.11*  CALCIUM 9.7  --  9.5  PROT 6.7  --   --   BILITOT 1.7*  --   --   ALKPHOS 41  --   --   ALT 13*  --   --   AST 37  --   --   GLUCOSE 119*  --  103*   UA without concerns for infection  Imaging/Diagnostic Tests: Dg Chest 1 View  Result Date: 12/23/2016 CLINICAL DATA:  Altered mental status. EXAM: CHEST 1 VIEW COMPARISON:  Chest radiographs 12/13/2013 FINDINGS: Unchanged heart size and mediastinal contours with tortuosity of the thoracic aorta. No pulmonary edema. Linear atelectasis or scarring in the left mid lung. No confluent airspace disease. No evidence of pleural fluid or pneumothorax. Multilevel degenerative change throughout the thoracic spine. IMPRESSION: No acute abnormality or change from prior exam. Electronically Signed   By: Jeb Levering M.D.   On: 12/23/2016 21:12   Ct Head Wo Contrast  Result Date: 12/23/2016 CLINICAL DATA:  Altered mental status EXAM: CT HEAD WITHOUT CONTRAST TECHNIQUE: Contiguous axial images were obtained from the base of the skull through the vertex without intravenous contrast. COMPARISON:  05/10/2016 FINDINGS: Brain: Large left hemispheric subdural hematoma which is predominantly low density. The hematoma measures 19 mm in thickness. There is mass-effect on the left cerebral hemisphere with effacement of the sulci. 13 mm midline shift to the right. Atrophy of the right cerebral hemisphere. Right temporal lobe dilated possibly with some trapping of the right lateral ventricle. Negative for acute ischemic infarction. Vascular: Negative for hyperdense vessel Skull: Negative for fracture Sinuses/Orbits: Mucosal edema left maxillary sinus.  Negative orbit. Other: None IMPRESSION: 19 mm left subdural hematoma. The fluid is relatively low density suggesting this is a subacute to chronic subdural hematoma. 13 mm midline shift to the right. Cerebral atrophy with progressive dilatation of the right temporal horn possibly due to obstruction of the right  lateral ventricle. These results were called by telephone at the time of interpretation on 12/23/2016 at 9:40 pm to Dr. Fredia Sorrow , who verbally acknowledged these results. Electronically Signed   By: Franchot Gallo M.D.   On: 12/23/2016 21:40   [place] Procedure Unlisted-no Report  Result Date: 12/23/2016 There is no Radiologist interpretation  for this exam.   Sela Hilding, MD 12/24/2016, 7:10 AM PGY-1, [place] Intern pager: [phone], text pages welcome

## 2016-12-25 ENCOUNTER — Inpatient Hospital Stay (HOSPITAL_COMMUNITY): Payer: Medicare HMO

## 2016-12-25 ENCOUNTER — Encounter (HOSPITAL_COMMUNITY): Payer: Self-pay | Admitting: Neurological Surgery

## 2016-12-25 DIAGNOSIS — J9601 Acute respiratory failure with hypoxia: Secondary | ICD-10-CM

## 2016-12-25 LAB — GLUCOSE, CAPILLARY
GLUCOSE-CAPILLARY: 125 mg/dL — AB (ref 65–99)
GLUCOSE-CAPILLARY: 119 mg/dL — AB (ref 65–99)
GLUCOSE-CAPILLARY: 116 mg/dL — AB (ref 65–99)
GLUCOSE-CAPILLARY: 129 mg/dL — AB (ref 65–99)
Glucose-Capillary: 114 mg/dL — ABNORMAL HIGH (ref 65–99)
Glucose-Capillary: 113 mg/dL — ABNORMAL HIGH (ref 65–99)

## 2016-12-25 LAB — BASIC METABOLIC PANEL
Anion gap: 9 (ref 5–15)
BUN: 28 mg/dL — ABNORMAL HIGH (ref 6–20)
CO2: 27 mmol/L (ref 22–32)
Calcium: 9.3 mg/dL (ref 8.9–10.3)
Chloride: 106 mmol/L (ref 101–111)
Creatinine, Ser: 0.9 mg/dL (ref 0.44–1.00)
GFR calc Af Amer: >60 mL/min (ref >60)
GFR calc non Af Amer: 57 mL/min — ABNORMAL LOW (ref >60)
Glucose, Bld: 110 mg/dL — ABNORMAL HIGH (ref 65–99)
Potassium: 3.8 mmol/L (ref 3.5–5.1)
Sodium: 142 mmol/L (ref 135–145)

## 2016-12-25 LAB — CBC
HCT: 27.3 % — ABNORMAL LOW (ref 36.0–46.0)
Hemoglobin: 8.8 g/dL — ABNORMAL LOW (ref 12.0–15.0)
MCH: 27.5 pg (ref 26.0–34.0)
MCHC: 32.2 g/dL (ref 30.0–36.0)
MCV: 85.3 fL (ref 78.0–100.0)
Platelets: 183 10*3/uL (ref 150–400)
RBC: 3.2 MIL/uL — ABNORMAL LOW (ref 3.87–5.11)
RDW: 14 % (ref 11.5–15.5)
WBC: 14.8 10*3/uL — ABNORMAL HIGH (ref 4.0–10.5)

## 2016-12-25 LAB — PHOSPHORUS: PHOSPHORUS: 3.5 mg/dL (ref 2.5–4.6)

## 2016-12-25 LAB — ECHOCARDIOGRAM COMPLETE
HEIGHTINCHES: 68 in
WEIGHTICAEL: 1996.49 [oz_av]

## 2016-12-25 LAB — MAGNESIUM: Magnesium: 1.8 mg/dL (ref 1.7–2.4)

## 2016-12-25 MED ORDER — MAGNESIUM SULFATE 2 GM/50ML IV SOLN
2.0000 g | Freq: Once | INTRAVENOUS | Status: AC
  Administered 2016-12-25: 2 g via INTRAVENOUS
  Filled 2016-12-25: qty 50

## 2016-12-25 MED ORDER — WHITE PETROLATUM GEL
Status: AC
  Administered 2016-12-25: 08:00:00
  Filled 2016-12-25: qty 1

## 2016-12-25 NOTE — Progress Notes (Signed)
Initial Nutrition Assessment  DOCUMENTATION CODES:   Severe malnutrition in context of social or environmental circumstances  INTERVENTION:   Once diet is advanced will supplement as appropriate.   If enteral nutrition support needed recommend: Osmolite 1.2 @ 20 ml/hr and increase by 10 ml every 12 hours to goal rate of 50 ml/hr Provides: 1540 kcal, 81 grams protein, and 972 ml free water.  Recommend monitor magnesium, potassium, and phosphorus daily for at least 3 days, MD to replete as needed, as pt is at risk for refeeding syndrome given severe malnutrition.  NUTRITION DIAGNOSIS:   Malnutrition (Severe) related to social / environmental circumstances as evidenced by severe depletion of muscle mass, severe depletion of body fat.  GOAL:   Patient will meet greater than or equal to 90% of their needs  MONITOR:   Diet advancement, I & O's  REASON FOR ASSESSMENT:   Malnutrition Screening Tool    ASSESSMENT:   83 y/o F admitted 3/28 with AMS.  Found to have L 62mm SDH.  To OR 3/29 am for evacuation of hematoma.   Son and grandson at bedside and offer hx. Per son pt has been refusing solid foods, drinking some ensure for the last 10 days after falling out of bed. She has been living with him during this time. Prior to the last 10 days pt had been living at home with her husband and family brings them food. Per report both patient and her husband are mostly blind and are unable to cook. Son is unable to answer how much of what she was eating prior to the last 10 days.   Nutrition-Focused physical exam was difficult to complete as pt did not want anyone touching her. Her son held her hand and reassured her during my exam but did make it difficult. Findings are severe fat depletion, severe muscle depletion, and no edema. Per son pt was able to walk on her own PTA.   Pt would likely pull any feeding tube if it were attempted.  Awaiting SLP evaluation Pt meets criteria for severe  malnutrition, suspect from environmental circumstances.   Diet Order:  Diet NPO time specified  Skin:  Reviewed, no issues  Last BM:  unknown  Height:   Ht Readings from Last 1 Encounters:  12/24/16 5\' 8"  (1.727 m)    Weight:   Wt Readings from Last 1 Encounters:  12/24/16 124 lb 12.5 oz (56.6 kg)    Ideal Body Weight:  63.6 kg  BMI:  Body mass index is 18.97 kg/m.  Estimated Nutritional Needs:   Kcal:  1400-1600  Protein:  70-90 grams  Fluid:  > 1.5 L/day  EDUCATION NEEDS:   No education needs identified at this time  [place], [place], [place] Pager [phone] After Hours Pager

## 2016-12-25 NOTE — Evaluation (Signed)
Clinical/Bedside Swallow Evaluation Patient Details  Name: Brittany Christian MRN: [id number] Date of Birth: [birth year]  Today's Date: 12/25/2016 Time: SLP Start Time (ACUTE ONLY): 1001 SLP Stop Time (ACUTE ONLY): 1017 SLP Time Calculation (min) (ACUTE ONLY): 16 min  Past Medical History:  Past Medical History:  Diagnosis Date  . Cancer ([place])   . Hypertension    Past Surgical History:  Past Surgical History:  Procedure Laterality Date  . ABDOMINAL HYSTERECTOMY    . CRANIOTOMY Left 12/24/2016   Procedure: CRANIOTOMY HEMATOMA EVACUATION SUBDURAL;  Surgeon: Kevan Ny Ditty, MD;  Location: [facility];  Service: Neurosurgery;  Laterality: Left;  Marland Kitchen MASTECTOMY     HPI:  Pt is an 83 y.o. female admitted on 12/23/16 after a reported 10-day history of altered mental status since a fall on 3/18. CT showed L SDH with midline shift. Pt now s/p L frontotemporal crani 3/29. Pertinent PMH includes HTN, dementia, cancer.    Assessment / Plan / Recommendation Clinical Impression  Pt was sitting upright in her chair and was awake but keeping her eyes closed throughout evaluation. Despite Max cues and encouragement from granddaughter, pt clenched her lips and turned her head away from all POs offered. Since she actively refused all POs offered, SLP is unable to provide recommendations for least restrictive diet. Will f/u on next date to reattempt diagnostic trials. SLP Visit Diagnosis: Dysphagia, unspecified (R13.10)    Aspiration Risk  Other (comment) (difficult to assess)    Diet Recommendation Other (Comment) (unable to recommend at this time)   Medication Administration: Via alternative means    Other  Recommendations Oral Care Recommendations: Oral care QID   Follow up Recommendations  (tba)      Frequency and Duration min 2x/week  2 weeks       Prognosis Prognosis for Safe Diet Advancement: Fair Barriers to Reach Goals: Behavior      Swallow Study   General HPI: Pt is an 83 y.o.  female admitted on 12/23/16 after a reported 10-day history of altered mental status since a fall on 3/18. CT showed L SDH with midline shift. Pt now s/p L frontotemporal crani 3/29. Pertinent PMH includes HTN, dementia, cancer.  Type of Study: Bedside Swallow Evaluation Previous Swallow Assessment: none in chart Diet Prior to this Study: NPO Temperature Spikes Noted: No Respiratory Status: Room air History of Recent Intubation: No Behavior/Cognition: Alert;Uncooperative Oral Cavity Assessment: Other (comment) (does not open mouth for assessment) Oral Care Completed by SLP: No (attempted - pt held her lips tightly closed) Oral Cavity - Dentition: Other (Comment) (unable to assess) Vision:  (keeps her eyes closed) Self-Feeding Abilities: Total assist Patient Positioning: Upright in chair Baseline Vocal Quality: Not observed Volitional Cough: Cognitively unable to elicit Volitional Swallow: Unable to elicit    Oral/Motor/Sensory Function Overall Oral Motor/Sensory Function: Other (comment) (does not follow commands to assess)   Ice Chips Ice chips: Impaired Presentation: Spoon Oral Phase Impairments: Other (comment) (no oral acceptance)   Thin Liquid Thin Liquid: Impaired Presentation: Cup;Spoon;Straw Oral Phase Impairments: Other (comment) (no oral acceptance)    Nectar Thick Nectar Thick Liquid: Not tested   Honey Thick Honey Thick Liquid: Not tested   Puree Puree: Impaired Presentation: Spoon Oral Phase Impairments: Other (comment) (no oral acceptance)   Solid   GO   Solid: Not tested        Germain Osgood 12/25/2016,12:53 PM   Germain Osgood, M.A. CCC-SLP [phone]

## 2016-12-25 NOTE — Progress Notes (Signed)
eLink Physician-Brief Progress Note Patient Name: Brittany Christian DOB: [birth year] MRN: [id number]   Date of Service  12/25/2016  HPI/Events of Note  No uop since foley out and bladder scan up to 500 cc   eICU Interventions  Replace foley for now     Intervention Category Intermediate Interventions: Oliguria - evaluation and management Minor Interventions: Routine modifications to care plan (e.g. PRN medications for pain, fever)  Christinia Gully 12/25/2016, 5:52 PM

## 2016-12-25 NOTE — Progress Notes (Signed)
OT Cancellation Note  Patient Details Name: Brittany Christian MRN: [id number] DOB: July 28, 1931   Cancelled Treatment:    Reason Eval/Treat Not Completed: Patient at procedure or test/ unavailable;Fatigue/lethargy limiting ability to participate.  Pt with SLP first attempt, then just had gotten back to bed and was fatigued.  Will reattempt.  Keefer Soulliere [place], OTR/L [phone]   Lucille Passy M 12/25/2016, 3:13 PM

## 2016-12-25 NOTE — Evaluation (Signed)
Physical Therapy Evaluation Patient Details Name: Brittany Christian MRN: [id number] DOB: Aug 17, 1931 Today's Date: 12/25/2016   History of Present Illness  Pt is an 83 y.o. female admitted on 12/23/16 after a reported 10-day history of altered mental status since a fall on 3/18. CT showed L SDH with midline shift. Pt now s/p L frontotemporal crani 3/29. Pertinent PMH includes HTN, dementia, cancer.   Clinical Impression  Pt presents to PT with decreased awareness, impaired vision (?), decreased participation, and an overall decrease in functional mobility secondary to above. Pt was not communicating with therapy during session, but son reports she was indep with all mobility PTA and cares for her husband at home. Children live nearby and are able to provide 24/7 assist upon d/c. Today, pt required totalA for bed mobility and stand pivot to chair; limited by lethargy and performing strong resistance to all movement. Question visual impairments may be contributing to fear of mobility. Pt would benefit from continued acute PT to maximize functional mobility and independence.     Follow Up Recommendations SNF;Supervision/Assistance - 24 hour    Equipment Recommendations  Other (comment) (Defer to next venue)    Recommendations for Other Services Rehab consult     Precautions / Restrictions Precautions Precautions: Fall Restrictions Weight Bearing Restrictions: No      Mobility  Bed Mobility Overal bed mobility: Needs Assistance Bed Mobility: Supine to Sit     Supine to sit: Total assist     General bed mobility comments: TotalA to sit EOB with pt resisting all therapist movement; pt able to lift LLE onto chair once seated  Transfers Overall transfer level: Needs assistance Equipment used: None Transfers: Squat Pivot Transfers     Squat pivot transfers: Total assist;+2 physical assistance;+2 safety/equipment     General transfer comment: TotalA to transfer to chair with bilat  HHA; squat pivot secondary to pt with complete BLE extension and significant posterior lean with transfer.   Ambulation/Gait                Stairs            Wheelchair Mobility    Modified Rankin (Stroke Patients Only) Modified Rankin (Stroke Patients Only) Pre-Morbid Rankin Score: No symptoms Modified Rankin: Severe disability     Balance Overall balance assessment: Needs assistance Sitting-balance support: No upper extremity supported;Feet supported Sitting balance-Leahy Scale: Zero Sitting balance - Comments: Significant posterior lean with pt resisting assist to sit upright Postural control: Posterior lean Standing balance support: Bilateral upper extremity supported Standing balance-Leahy Scale: Zero Standing balance comment: Pt resisting standing fully upright, requiring total assist to stand pivot to chair                             Pertinent Vitals/Pain Pain Assessment: Faces Pain Score: 0-No pain    Home Living Family/patient expects to be discharged to:: Private residence Living Arrangements: Spouse/significant other Available Help at Discharge: Family;Available 24 hours/day (Children live nearby) Type of Home: House                Prior Function Level of Independence: Independent         Comments: Indep with all functional mobility; stopped driving in November. Cared for husband at home; cooks, cleans, and performs all ADLs.      Hand Dominance        Extremity/Trunk Assessment   Upper Extremity Assessment Upper Extremity Assessment: Difficult to assess due  to impaired cognition (Grip strength 5/5; BUE strength >3/5 based on functional tasks and pt resisting all movement from therapist)    Lower Extremity Assessment Lower Extremity Assessment: Difficult to assess due to impaired cognition (BLE flexor/extensor strength >3/5 based on functional tasks and pt resisting all movement assist from therapist)    Cervical /  Trunk Assessment Cervical / Trunk Assessment: Normal (Core strength >3/5 based on functional tasks and pt resisting all movements)  Communication   Communication: Other (comment) (Pt only occasionally opening her eyes; voiced incomprehensible words 1x)  Cognition Arousal/Alertness: Lethargic Behavior During Therapy: Flat affect Overall Cognitive Status: Difficult to assess Area of Impairment: Following commands                       Following Commands: Follows one step commands inconsistently       General Comments: Pt only occasionally opening her eyes and moving to participate with therapy with max encouragement and multimodal cues. Did not respond to questions, only voicing incomprehensible words 1x. Son present at end of session and stating pt has no cognitive deficits at baseline; not sure if this is reliable as pt has dementia per PMH. Pt appears to have impaired vision, but unable to fully assess secondary to lack of involvement and eye opening (son reports no vision deficits at baseline); pt startles to touch.       General Comments      Exercises     Assessment/Plan    PT Assessment Patient needs continued PT services  PT Problem List Decreased strength;Decreased balance;Decreased cognition;Decreased knowledge of precautions;Decreased knowledge of use of DME;Decreased mobility;Decreased range of motion;Decreased activity tolerance;Decreased coordination;Decreased safety awareness       PT Treatment Interventions DME instruction;Gait training;Stair training;Therapeutic exercise;Therapeutic activities;Functional mobility training;Balance training;Patient/family education;Neuromuscular re-education;Cognitive remediation    PT Goals (Current goals can be found in the Care Plan section)  Acute Rehab PT Goals Patient Stated Goal: Pt unable to voice goals PT Goal Formulation: Patient unable to participate in goal setting Time For Goal Achievement: 01/08/17     Frequency Min 3X/week   Barriers to discharge        Co-evaluation               End of Session Equipment Utilized During Treatment: Gait belt Activity Tolerance: Patient limited by lethargy (Pt presents as lethargic with eyes closed most of session; not sure if this is actual lethargy or pt wanting to be left alone) Patient left: in chair;with call bell/phone within reach;with chair alarm set;with family/visitor present Nurse Communication: Mobility status PT Visit Diagnosis: Other abnormalities of gait and mobility (R26.89)    Time: 7001-7494 PT Time Calculation (min) (ACUTE ONLY): 24 min   Charges:   PT Evaluation $PT Eval Low Complexity: 1 Procedure PT Treatments $Therapeutic Activity: 8-22 mins   PT G Codes:       Enis Gash, SPT Office-[phone]  Mabeline Caras 12/25/2016, 12:25 PM

## 2016-12-25 NOTE — Progress Notes (Signed)
  Echocardiogram 2D Echocardiogram limited has been performed. Technically limited due to patient cooperation.   Brittany Christian M 12/25/2016, 9:08 AM

## 2016-12-25 NOTE — Progress Notes (Signed)
PULMONARY / CRITICAL CARE MEDICINE   Name: Brittany Christian MRN: [id number] DOB: 01-27-1931    ADMISSION DATE:  12/23/2016 CONSULTATION DATE:  12/24/16  REFERRING MD:  Dr. Cyndy Freeze   CHIEF COMPLAINT:  S/P Left Craniotomy for Evacuation of Hematoma   HISTORY OF PRESENT ILLNESS:  83 y/o F who presented 3/28 to [facility] with a 10 day history of altered mental status, poor PO intake, difficulty walking and lower extremity swelling.    On admit, family reported they found her on the floor incontinent but it was an unwitnessed event.  Prior to episode, the patient was reportedly able to independently perform ADL's.  CT of the head was assessed which demonstrated a 19 mm left subdural hematoma thought to be subacute to chronic with a 13 mm midline right shift, cerebral atrophy with progressive dilation of the right temporal horn possibly due to obstruction of the right lateral ventricle. She was admitted by FPTS and evaluated by Neurosurgery.  The patient underwent left craniotomy on 3/28 per Dr. Cyndy Freeze for evacuation of subdural hematoma.  She was extubated and returned to ICU.    PCCM consulted for medical management in ICU.     SUBJECTIVE:  Uncooperative but stable  VITAL SIGNS: BP 139/60   Pulse 67   Temp 98.9 F (37.2 C) (Axillary)   Resp (!) 9   Ht 5\' 8"  (3.614 m)   Wt 56.6 kg (124 lb 12.5 oz)   SpO2 95%   BMI 18.97 kg/m   HEMODYNAMICS:    VENTILATOR SETTINGS:    INTAKE / OUTPUT: I/O last 3 completed shifts: In: 4087.9 [I.V.:3617.9; IV Piggyback:470] Out: 1860 [Urine:1710; Blood:150]  PHYSICAL EXAMINATION: General:  Elderly female in NAD, lying in bed Neuro:  Head wrapped, staples left temporal noted under dressing, follows some commands but mostly uncooperative.  HEENT:  MM pink / moist, no jvd  Cardiovascular:  s1s2 rrr, no m/r/g  hsr Lungs:  Even/non-labored, lcta Abdomen:  Soft, bsx4 active  Musculoskeletal:  intact Skin:  wdi  LABS:  BMET  Recent Labs Lab  12/23/16 1536 12/23/16 2018 12/24/16 0522 12/25/16 0600  NA 140  --  142 142  K 5.2* 3.8 3.3* 3.8  CL 101  --  103 106  CO2 26  --  28 27  BUN 35*  --  33* 28*  CREATININE 1.51*  --  1.11* 0.90  GLUCOSE 119*  --  103* 110*    Electrolytes  Recent Labs Lab 12/23/16 1536 12/24/16 0522 12/25/16 0600  CALCIUM 9.7 9.5 9.3  MG  --   --  1.8  PHOS  --   --  3.5    CBC  Recent Labs Lab 12/24/16 0522 12/24/16 1338 12/25/16 0600  WBC 5.7 6.7 14.8*  HGB 10.4* 8.3* 8.8*  HCT 32.3* 26.4* 27.3*  PLT 150 156 183    Coag's  Recent Labs Lab 12/24/16 1419  INR 1.19    Sepsis Markers  Recent Labs Lab 12/23/16 1552 12/23/16 2024  LATICACIDVEN 1.11 1.10    ABG No results for input(s): PHART, PCO2ART, PO2ART in the last 168 hours.  Liver Enzymes  Recent Labs Lab 12/23/16 1536  AST 37  ALT 13*  ALKPHOS 41  BILITOT 1.7*  ALBUMIN 3.6    Cardiac Enzymes No results for input(s): TROPONINI, PROBNP in the last 168 hours.  Glucose  Recent Labs Lab 12/24/16 0742 12/24/16 1702 12/24/16 1958 12/24/16 2326 12/25/16 0314 12/25/16 0804  GLUCAP 92 127* 160* 139* 125* 119*  Imaging Ct Head Wo Contrast  Result Date: 12/25/2016 CLINICAL DATA:  83 y/o  F; subdural hematoma for follow-up. EXAM: CT HEAD WITHOUT CONTRAST TECHNIQUE: Contiguous axial images were obtained from the base of the skull through the vertex without intravenous contrast. COMPARISON:  12/24/2016 CT of the head. FINDINGS: Brain: Status post left-sided craniotomy with postsurgical changes including pneumocephalus over the right cerebral convexity concentrated greatest over the left frontal lobe, scalp soft tissue swelling, and surgical clips. The subdural hematoma over the left cerebral convexity is decreased in thickness measuring approximately 10 mm and there is now a epidural collection subjacent to the craniotomy flap which measures 8 mm in thickness. Overall fluid accumulated in the extra-axial  space over the left-sided convexity is mildly decreased as is mass effect with 10 mm of left-to-right midline shift. There is persistent entrapment of the right lateral ventricle, effacement of the left lateral ventricle, and diffuse sulcal effacement of the left cerebral hemisphere. No evidence for new acute brain parenchymal hemorrhage or large territory stroke. Vascular: Calcific atherosclerosis of cavernous and paraclinoid internal carotid arteries. Skull: Large left-sided craniotomy postsurgical changes fixed by screws and plates. Sinuses/Orbits: No acute finding. Other: None. IMPRESSION: 1. Interval left-sided craniotomy with pneumocephalus and low-attenuation epidural collection subjacent to the craniotomy flap. Decreased size of subdural hematoma. 2. Decreased mass effect with 10 mm of residual left-to-right midline shift and persistent entrapment of the right lateral ventricle. 3. No evidence for new acute brain parenchymal hemorrhage or large territory infarct. Electronically Signed   By: Kristine Garbe M.D.   On: 12/25/2016 06:57   Dg Chest Port 1 View  Result Date: 12/25/2016 CLINICAL DATA:  Acute respiratory failure with hypoxia EXAM: PORTABLE CHEST 1 VIEW COMPARISON:  12/23/2016 FINDINGS: Mild bibasilar airspace disease left greater than right has developed since prior study. Negative for heart failure or edema. No pleural effusion. IMPRESSION: Interval development of mild bibasilar airspace disease, left greater than right Electronically Signed   By: Franchot Gallo M.D.   On: 12/25/2016 07:42     STUDIES:  CT Head 3/29 >> 19 mm left subdural hematoma thought to be subacute to chronic with a 13 mm midline right shift, cerebral atrophy with progressive dilation of the right temporal horn possibly due to obstruction of the right lateral ventricle.  CT Head 3/29 >> no appreciable change in size of large left sided subdural hematoma, midline shift 52mm ECHO 3/29 >>  CT Head 3/30 >>    CULTURES:   ANTIBIOTICS: Ancef 3/29 >>   SIGNIFICANT EVENTS: 3/28  Admit with AMS, found to have a SDH  3/29  OR for L crani / evacuation of hematoma   LINES/TUBES:   DISCUSSION: 83 y/o F admitted 3/28 with AMS.  Found to have L 91mm SDH.  To OR 3/29 am for evacuation of hematoma.  Little for PCCM to offer.  ASSESSMENT / PLAN:  NEUROLOGIC A:   Left Subdural Hematoma - suspect subacute, see CT above  Acute Encephalopathy - secondary to SDH with midline shift  ? Baseline Dementia - reported as "mild" P:   RASS goal: n/a Frequent neuro exams  Minimize sedating medications as able Post-operative care per NSGY  Continue decadron  Keppra Q12 for 7 days PRN percocet / dilaudid for pain   PULMONARY A: At Risk Aspiration in setting of AMS  P:   Aspiration precautions  NPO until SLP evaluation  Intermittent CXR  CARDIOVASCULAR A:  Hypertension  P:  Tele monitoring in ICU  PRN  Labetalol  Hold home verapamil until swallowing can be assessed   RENAL Lab Results  Component Value Date   CREATININE 0.90 12/25/2016   CREATININE 1.11 (H) 12/24/2016   CREATININE 1.51 (H) 12/23/2016   CREATININE 0.53 (L) 12/13/2013     Recent Labs Lab 12/23/16 2018 12/24/16 0522 12/25/16 0600  K 3.8 3.3* 3.8     A:   Hypokalemia resolved AKI - resolved with IVF's P:   Trend BMP / urinary output Replace electrolytes as indicated Avoid nephrotoxic agents, ensure adequate renal perfusion  GASTROINTESTINAL A:   At Risk Aspiration  At Risk Constipation  P:   NPO until SLP evaluation  PPI  Bowel regimen as ordered   HEMATOLOGIC  Recent Labs  12/24/16 1338 12/25/16 0600  HGB 8.3* 8.8*    A:   Anemia - new this admit, suspect in setting of recent blood loss / SDH P:  Monitor CBC Transfuse for Hgb <7%  INFECTIOUS A:   Post-operative Craniotomy  P:   Ancef post op per NSGY  Monitor fever curve / WBC   ENDOCRINE CBG (last 3)   Recent Labs   12/24/16 2326 12/25/16 0314 12/25/16 0804  GLUCAP 139* 125* 119*     A:   Hyperglycemia - mild  P:   Monitor glucose with decadron  Add SSI while on steroids     FAMILY  - Updates: No family available at present.    - Inter-disciplinary family meet or Palliative Care meeting due by:  4/5   NP CC Time: 30 minutes   Richardson Landry Baley Shands ACNP Maryanna Shape PCCM Pager [phone] till 3 pm If no answer page [phone] 12/25/2016, 8:48 AM

## 2016-12-25 NOTE — Discharge Summary (Signed)
[facility] Discharge Summary  Patient name: Brittany Christian record number: [id number] Date of birth: January 03, 1931 Age: 83 y.o. Gender: female Date of Admission: 12/23/2016  Date of Discharge: 01/01/2017 Admitting Physician: Kinnie Feil, MD  Primary Care Provider: Madelyn Brunner, MD Consultants: Neurosurgery  Indication for Hospitalization: Subdural hematoma  Discharge Diagnoses/Problem List:  Subdural hematoma s/p craniotomy HTN Anemia Dementia AKI, resolved  Disposition: SNF  Discharge Condition: Stable/Improved  Discharge Exam: Per progress note dated day of discharge  Temp:  [97.4 F (36.3 C)-98.7 F (37.1 C)] 98.7 F (37.1 C) (04/06 0859) Pulse Rate:  [70-108] 73 (04/06 0859) Resp:  [19-20] 20 (04/06 0859) BP: (159-175)/(76-96) 166/78 (04/06 0859) SpO2:  [98 %-100 %] 98 % (04/06 0859) Physical Exam: General: Lying comfortably in bed, in no NAD. HEENT: L side of head shaved with staples that are clean and dry without erythema. Cardiovascular: RRR, no murmurs Respiratory: CTAB, normal effort on room air Abdomen: soft, nontender, nondistended, + bowel sounds Extremities: no edema Neuro: oriented to self only, conversing well.    Brief Hospital Course:  Brittany Blondin Cloughis a 83 y.o.femalewith PMH of HTN, dementia at baseline who presented with AMS x 10 days after being found on the floor without a witnessed fall.  Subdural hematoma s/p craniotomy Patient presented with decreased interactions and increased somnolence x 10 days. Family reports that she was found down on 3/18, confused with incontinence of bowel. CT head with 17mm left subacute subdural hematoma with 80mm midline shift to the right, as well as cerebral atrophy. Neurosurgery was consulted and performed a L frontotemporopareital craniotomy for evacuation of subdural hematoma. She was then extubated and monitored in the ICU post op. Received decadron, Keppra IV for 7  days with prn percocet for pain control. She was neurologically stable and was transferred out of the ICU with continued improvement to her neurological baseline during her hospital stay. Her hgb was monitored given her history of anemia (baseline Hgb ~10.5) with acute drop due to [place] which stabilized ~9.  AKI, resolved Patient on admission had an AKI likely prerenal d/t limited po intake. Peak creatinine 1.51 and returned to baseline during her hospital stay.  Issues for Follow Up:  1. Please follow up on staple removal of skin closure s/p craniotomy. 2. Monitor BP, discharged on home BP medication of verapamil 240mg  CR qd  Significant Procedures: L frontotemporopareital craniotomy   Significant Labs and Imaging:   Recent Labs Lab 12/30/16 0911 12/31/16 0408 01/01/17 0612  WBC 6.5 6.2 6.4  HGB 8.1* 8.7* 8.9*  HCT 25.4* 26.7* 27.7*  PLT 321 363 400    Recent Labs Lab 12/26/16 0317 12/27/16 0301 12/28/16 0712 12/29/16 0335 12/30/16 0411 12/31/16 0408 01/01/17 0612  NA 145 142  --  142 137 136 136  K 4.1 3.7  --  3.2* 3.2* 3.1* 3.4*  CL 108 108  --  108 103 101 99*  CO2 25 23  --  24 25 26 27   GLUCOSE 131* 118*  --  81 91 97 112*  BUN 22* 21*  --  14 8 5* <5*  CREATININE 0.72 0.70  --  0.57 0.49 0.50 0.45  CALCIUM 9.3 9.0  --  8.7* 8.6* 8.7* 9.1  MG  --   --   --  1.6* 1.5* 1.8  --   PHOS 2.7 2.4* 2.6 2.5 2.6  --   --   ALBUMIN  --   --   --  2.4* 2.6*  --   --       Results/Tests Pending at Time of Discharge: None  Discharge Medications:  Allergies as of 01/01/2017   No Known Allergies     Medication List    STOP taking these medications   VITAMIN C PO     TAKE these medications   feeding supplement (ENSURE ENLIVE) Liqd Take 237 mLs by mouth 2 (two) times daily between meals.   polyethylene glycol packet Commonly known as:  MIRALAX / GLYCOLAX Take 17 g by mouth daily as needed for mild constipation.   verapamil 240 MG CR tablet Commonly known as:   CALAN-SR Take 240 mg by mouth daily.       Discharge Instructions: Please refer to Patient Instructions section of EMR for full details.  Patient was counseled important signs and symptoms that should prompt return to medical care, changes in medications, dietary instructions, activity restrictions, and follow up appointments.   Follow-Up Appointments:  Contact information for follow-up providers    Sarina Ser, Hewitt Blade, MD. Schedule an appointment as soon as possible for a visit.   Specialty:  Internal Medicine Why:  Please make a hospital follow up appointment to be seen within 1 week after leaving the hospital. Contact information: [place] [phone]            Contact information for after-discharge care    Destination    [facility] Follow up.   Specialty:  [place] information: [address] [place]                  Everrett Coombe, MD 01/01/2017, 4:29 PM PGY-1, [place]

## 2016-12-25 NOTE — Progress Notes (Signed)
Awake and alert Saying some words but won't say her name Follows commands bilaterally CT shows minimal reaccumulation of acute blood, mostly spinal fluid filling the subdural and epidural space, midline shift improved Continue observation in the ICU for one more day.

## 2016-12-26 DIAGNOSIS — R401 Stupor: Secondary | ICD-10-CM

## 2016-12-26 LAB — BASIC METABOLIC PANEL
Anion gap: 12 (ref 5–15)
BUN: 22 mg/dL — AB (ref 6–20)
CALCIUM: 9.3 mg/dL (ref 8.9–10.3)
CHLORIDE: 108 mmol/L (ref 101–111)
CO2: 25 mmol/L (ref 22–32)
CREATININE: 0.72 mg/dL (ref 0.44–1.00)
GFR calc Af Amer: >60 mL/min (ref >60)
GFR calc non Af Amer: >60 mL/min (ref >60)
Glucose, Bld: 131 mg/dL — ABNORMAL HIGH (ref 65–99)
Potassium: 4.1 mmol/L (ref 3.5–5.1)
SODIUM: 145 mmol/L (ref 135–145)

## 2016-12-26 LAB — CBC WITH DIFFERENTIAL/PLATELET
BASOS PCT: 0 %
Basophils Absolute: 0 10*3/uL (ref 0.0–0.1)
EOS ABS: 0 10*3/uL (ref 0.0–0.7)
EOS PCT: 0 %
HCT: 25.7 % — ABNORMAL LOW (ref 36.0–46.0)
Hemoglobin: 8.1 g/dL — ABNORMAL LOW (ref 12.0–15.0)
LYMPHS ABS: 0.6 10*3/uL — AB (ref 0.7–4.0)
Lymphocytes Relative: 4 %
MCH: 27.1 pg (ref 26.0–34.0)
MCHC: 31.5 g/dL (ref 30.0–36.0)
MCV: 86 fL (ref 78.0–100.0)
MONOS PCT: 8 %
Monocytes Absolute: 1.3 10*3/uL — ABNORMAL HIGH (ref 0.1–1.0)
Neutro Abs: 15.1 10*3/uL — ABNORMAL HIGH (ref 1.7–7.7)
Neutrophils Relative %: 88 %
PLATELETS: 196 10*3/uL (ref 150–400)
RBC: 2.99 MIL/uL — AB (ref 3.87–5.11)
RDW: 14.1 % (ref 11.5–15.5)
WBC: 17 10*3/uL — AB (ref 4.0–10.5)

## 2016-12-26 LAB — PHOSPHORUS: PHOSPHORUS: 2.7 mg/dL (ref 2.5–4.6)

## 2016-12-26 LAB — GLUCOSE, CAPILLARY
GLUCOSE-CAPILLARY: 94 mg/dL (ref 65–99)
Glucose-Capillary: 110 mg/dL — ABNORMAL HIGH (ref 65–99)
Glucose-Capillary: 112 mg/dL — ABNORMAL HIGH (ref 65–99)
Glucose-Capillary: 121 mg/dL — ABNORMAL HIGH (ref 65–99)
Glucose-Capillary: 117 mg/dL — ABNORMAL HIGH (ref 65–99)
Glucose-Capillary: 140 mg/dL — ABNORMAL HIGH (ref 65–99)

## 2016-12-26 NOTE — Progress Notes (Signed)
Subjective: Patient reports sleepy, but arouses.  Dysarthric.  Objective: Vital signs in last 24 hours: Temp:  [98.6 F (37 C)-100.7 F (38.2 C)] 98.9 F (37.2 C) (03/31 0400) Pulse Rate:  [76-107] 76 (03/31 0700) Resp:  [6-15] 15 (03/31 0700) BP: (120-177)/(65-141) 150/69 (03/31 0700) SpO2:  [94 %-100 %] 97 % (03/31 0700)  Intake/Output from previous day: 03/30 0701 - 03/31 0700 In: 2060 [I.V.:1800; IV Piggyback:260] Out: 1075 [Urine:1075] Intake/Output this shift: No intake/output data recorded.  Physical Exam: Dressing CDI.  Awakens, dysarthric speech.  Confused.  Will follow commands.  Lab Results:  Recent Labs  12/25/16 0600 12/26/16 0317  WBC 14.8* 17.0*  HGB 8.8* 8.1*  HCT 27.3* 25.7*  PLT 183 196   BMET  Recent Labs  12/25/16 0600 12/26/16 0317  NA 142 145  K 3.8 4.1  CL 106 108  CO2 27 25  GLUCOSE 110* 131*  BUN 28* 22*  CREATININE 0.90 0.72  CALCIUM 9.3 9.3    Studies/Results: Ct Head Wo Contrast  Result Date: 12/25/2016 CLINICAL DATA:  83 y/o  F; subdural hematoma for follow-up. EXAM: CT HEAD WITHOUT CONTRAST TECHNIQUE: Contiguous axial images were obtained from the base of the skull through the vertex without intravenous contrast. COMPARISON:  12/24/2016 CT of the head. FINDINGS: Brain: Status post left-sided craniotomy with postsurgical changes including pneumocephalus over the right cerebral convexity concentrated greatest over the left frontal lobe, scalp soft tissue swelling, and surgical clips. The subdural hematoma over the left cerebral convexity is decreased in thickness measuring approximately 10 mm and there is now a epidural collection subjacent to the craniotomy flap which measures 8 mm in thickness. Overall fluid accumulated in the extra-axial space over the left-sided convexity is mildly decreased as is mass effect with 10 mm of left-to-right midline shift. There is persistent entrapment of the right lateral ventricle, effacement of the  left lateral ventricle, and diffuse sulcal effacement of the left cerebral hemisphere. No evidence for new acute brain parenchymal hemorrhage or large territory stroke. Vascular: Calcific atherosclerosis of cavernous and paraclinoid internal carotid arteries. Skull: Large left-sided craniotomy postsurgical changes fixed by screws and plates. Sinuses/Orbits: No acute finding. Other: None. IMPRESSION: 1. Interval left-sided craniotomy with pneumocephalus and low-attenuation epidural collection subjacent to the craniotomy flap. Decreased size of subdural hematoma. 2. Decreased mass effect with 10 mm of residual left-to-right midline shift and persistent entrapment of the right lateral ventricle. 3. No evidence for new acute brain parenchymal hemorrhage or large territory infarct. Electronically Signed   By: Kristine Garbe M.D.   On: 12/25/2016 06:57   Dg Chest Port 1 View  Result Date: 12/25/2016 CLINICAL DATA:  Acute respiratory failure with hypoxia EXAM: PORTABLE CHEST 1 VIEW COMPARISON:  12/23/2016 FINDINGS: Mild bibasilar airspace disease left greater than right has developed since prior study. Negative for heart failure or edema. No pleural effusion. IMPRESSION: Interval development of mild bibasilar airspace disease, left greater than right Electronically Signed   By: Franchot Gallo M.D.   On: 12/25/2016 07:42    Assessment/Plan: Urinary retention yesterday.  Doing OK following craniotomy for SDH.  Will observe in ICU and attempt to mobilize one more day.  Not quite ready for floor.    LOS: 81 days    Peggyann Shoals, MD 12/26/2016, 9:10 AM

## 2016-12-26 NOTE — Progress Notes (Signed)
  Speech Language Pathology Treatment: Dysphagia  Patient Details Name: Brittany Christian MRN: [id number] DOB: 1930/11/21 Today's Date: 12/26/2016 Time: 1345-1400 SLP Time Calculation (min) (ACUTE ONLY): 15 min  Assessment / Plan / Recommendation Clinical Impression  Pt seen for dysphagia treatment. RN present, reports Dilaudid administered recently. Pt alert with repositioning, pleasantly confused but remain orally defensive. With max cues for mouth opening, pt accepted ice chip x1, which she masticated but held orally for approximately 30 seconds prior to initiating swallow. Pt consistently refuses most PO attempts, though she does accept 1 cup sip of thin liquid, with reduced labial seal, oral holding, and suspected delayed swallow initiation. 1 tsp pureed with prolonged oral holding, required max cues to initiate swallow. Recommend continuing NPO, however pt may have ice chips PRN after oral care. SLP will f/u next date to reattempt diagnostic trials, feeding strategies to improve bolus acceptance.    HPI HPI: Pt is an 83 y.o. female admitted on 12/23/16 after a reported 10-day history of altered mental status since a fall on 3/18. CT showed L SDH with midline shift. Pt now s/p L frontotemporal crani 3/29. Pertinent PMH includes HTN, dementia, cancer.       SLP Plan  Continue with current plan of care       Recommendations  Diet recommendations: NPO;Other(comment) (ice chips after oral care) Liquids provided via: Teaspoon Medication Administration: Via alternative means Supervision: Full supervision/cueing for compensatory strategies Compensations: Slow rate;Small sips/bites Postural Changes and/or Swallow Maneuvers: Seated upright 90 degrees                Oral Care Recommendations: Oral care QID Follow up Recommendations: Other (comment) (TBD) SLP Visit Diagnosis: Dysphagia, unspecified (R13.10) Plan: Continue with current plan of care       [place], [place] CF-SLP Speech-Language Pathologist [phone]  Aliene Altes 12/26/2016, 5:05 PM

## 2016-12-26 NOTE — Progress Notes (Signed)
Socially rounded on Brittany Christian 3/30 and 3/31, patient sleeping both mornings peacefully. Appreciate the excellent care of neurosurgery and CCM, will resume care when they determine patient is stable for the floor.   Ralene Ok, MD

## 2016-12-26 NOTE — Progress Notes (Signed)
PULMONARY / CRITICAL CARE MEDICINE   Name: Brittany Christian MRN: [id number] DOB: 03-10-1931    ADMISSION DATE:  12/23/2016 CONSULTATION DATE:  12/24/16  REFERRING MD:  Dr. Cyndy Freeze   CHIEF COMPLAINT:  S/P Left Craniotomy for Evacuation of Hematoma   HISTORY OF PRESENT ILLNESS:  83 y/o F who presented 3/28 to [facility] with a 10 day history of altered mental status, poor PO intake, difficulty walking and lower extremity swelling.    On admit, family reported they found her on the floor incontinent but it was an unwitnessed event.  Prior to episode, the patient was reportedly able to independently perform ADL's.  CT of the head was assessed which demonstrated a 19 mm left subdural hematoma thought to be subacute to chronic with a 13 mm midline right shift, cerebral atrophy with progressive dilation of the right temporal horn possibly due to obstruction of the right lateral ventricle. She was admitted by FPTS and evaluated by Neurosurgery.  The patient underwent left craniotomy on 3/28 per Dr. Cyndy Freeze for evacuation of subdural hematoma.  She was extubated and returned to ICU.    PCCM consulted for medical management in ICU.     SUBJECTIVE:  Uncooperative but stable. Little for PCCM to offer  VITAL SIGNS: BP (!) 150/69   Pulse 76   Temp 99.6 F (37.6 C) (Axillary)   Resp 15   Ht 5\' 8"  (1.727 m)   Wt 56.6 kg (124 lb 12.5 oz)   SpO2 97%   BMI 18.97 kg/m   HEMODYNAMICS:    VENTILATOR SETTINGS:    INTAKE / OUTPUT: I/O last 3 completed shifts: In: 3125 [I.V.:2710; IV Piggyback:415] Out: 1500 [Urine:1500]  PHYSICAL EXAMINATION: General:  Elderly female in NAD, lying in bed Neuro:  Head wrapped, staples left temporal noted under dressing, follows some commands but mostly uncooperative.  HEENT:  MM pink / moist, no jvd  Cardiovascular:  s1s2 rrr, no m/r/g  hsr Lungs:  Even/non-labored,CTA Abdomen:  Soft, bsx4 active  Musculoskeletal:  intact  Skin:  WDI  LABS:  BMET  Recent  Labs Lab 12/24/16 0522 12/25/16 0600 12/26/16 0317  NA 142 142 145  K 3.3* 3.8 4.1  CL 103 106 108  CO2 28 27 25   BUN 33* 28* 22*  CREATININE 1.11* 0.90 0.72  GLUCOSE 103* 110* 131*    Electrolytes  Recent Labs Lab 12/24/16 0522 12/25/16 0600 12/26/16 0317  CALCIUM 9.5 9.3 9.3  MG  --  1.8  --   PHOS  --  3.5 2.7    CBC  Recent Labs Lab 12/24/16 1338 12/25/16 0600 12/26/16 0317  WBC 6.7 14.8* 17.0*  HGB 8.3* 8.8* 8.1*  HCT 26.4* 27.3* 25.7*  PLT 156 183 196    Coag's  Recent Labs Lab 12/24/16 1419  INR 1.19    Sepsis Markers  Recent Labs Lab 12/23/16 1552 12/23/16 2024  LATICACIDVEN 1.11 1.10    ABG No results for input(s): PHART, PCO2ART, PO2ART in the last 168 hours.  Liver Enzymes  Recent Labs Lab 12/23/16 1536  AST 37  ALT 13*  ALKPHOS 41  BILITOT 1.7*  ALBUMIN 3.6    Cardiac Enzymes No results for input(s): TROPONINI, PROBNP in the last 168 hours.  Glucose  Recent Labs Lab 12/25/16 1135 12/25/16 1715 12/25/16 2004 12/25/16 2322 12/26/16 0343 12/26/16 0818  GLUCAP 114* 116* 129* 113* 110* 112*    Imaging No results found.   STUDIES:  CT Head 3/29 >> 19 mm left subdural  hematoma thought to be subacute to chronic with a 13 mm midline right shift, cerebral atrophy with progressive dilation of the right temporal horn possibly due to obstruction of the right lateral ventricle.  CT Head 3/29 >> no appreciable change in size of large left sided subdural hematoma, midline shift 36mm ECHO 3/29 >>  CT Head 3/30 >>   CULTURES:   ANTIBIOTICS: Ancef 3/29 >> off  SIGNIFICANT EVENTS: 3/28  Admit with AMS, found to have a SDH  3/29  OR for L crani / evacuation of hematoma   LINES/TUBES:   DISCUSSION: 83 y/o F admitted 3/28 with AMS.  Found to have L 77mm SDH.  To OR 3/29 am for evacuation of hematoma.  Little for PCCM to offer. [place] for floor from PCCM side.  ASSESSMENT / PLAN:  NEUROLOGIC A:   Left Subdural  Hematoma - suspect subacute, see CT above  Acute Encephalopathy - secondary to SDH with midline shift  ? Baseline Dementia - reported as "mild" P:   RASS goal: n/a Frequent neuro exams  Minimize sedating medications as able Post-operative care per NSGY  Continue decadron  Keppra Q12 for 7 days PRN percocet / dilaudid for pain   PULMONARY A: At Risk Aspiration in setting of AMS  P:   Aspiration precautions  NPO until SLP evaluation  Intermittent CXR  CARDIOVASCULAR A:  Hypertension  P:  Tele monitoring in ICU  PRN Labetalol  Hold home verapamil until swallowing can be assessed   RENAL Lab Results  Component Value Date   CREATININE 0.72 12/26/2016   CREATININE 0.90 12/25/2016   CREATININE 1.11 (H) 12/24/2016   CREATININE 0.53 (L) 12/13/2013     Recent Labs Lab 12/24/16 0522 12/25/16 0600 12/26/16 0317  K 3.3* 3.8 4.1     A:   Hypokalemia resolved AKI - resolved with IVF's P:   Trend BMP / urinary output Replace electrolytes as indicated Avoid nephrotoxic agents, ensure adequate renal perfusion  GASTROINTESTINAL A:   At Risk Aspiration  At Risk Constipation  P:   NPO until SLP evaluation  PPI  Bowel regimen as ordered  She wont cooperate for feeds  HEMATOLOGIC  Recent Labs  12/25/16 0600 12/26/16 0317  HGB 8.8* 8.1*    A:   Anemia - new this admit, suspect in setting of recent blood loss / SDH P:  Monitor CBC Transfuse for Hgb <7%  INFECTIOUS A:   Post-operative Craniotomy  P:   Ancef post op per NSGY  Monitor fever curve / WBC   ENDOCRINE CBG (last 3)   Recent Labs  12/25/16 2322 12/26/16 0343 12/26/16 0818  GLUCAP 113* 110* 112*     A:   Hyperglycemia - mild  P:   Monitor glucose with decadron  Add SSI while on steroids     FAMILY  - Updates: Son updated  - Inter-disciplinary family meet or Palliative Care meeting due by:  4/5     Richardson Landry Nazarene Bunning ACNP Maryanna Shape PCCM Pager [phone] till 3 pm If no answer  page [phone] 12/26/2016, 10:19 AM

## 2016-12-27 LAB — URINALYSIS, ROUTINE W REFLEX MICROSCOPIC
BACTERIA UA: NONE SEEN
BILIRUBIN URINE: NEGATIVE
GLUCOSE, UA: NEGATIVE mg/dL
KETONES UR: 80 mg/dL — AB
Leukocytes, UA: NEGATIVE
Nitrite: NEGATIVE
PROTEIN: NEGATIVE mg/dL
SPECIFIC GRAVITY, URINE: 1.016 (ref 1.005–1.030)
pH: 5 (ref 5.0–8.0)

## 2016-12-27 LAB — GLUCOSE, CAPILLARY
GLUCOSE-CAPILLARY: 121 mg/dL — AB (ref 65–99)
Glucose-Capillary: 108 mg/dL — ABNORMAL HIGH (ref 65–99)
Glucose-Capillary: 109 mg/dL — ABNORMAL HIGH (ref 65–99)
Glucose-Capillary: 113 mg/dL — ABNORMAL HIGH (ref 65–99)
Glucose-Capillary: 109 mg/dL — ABNORMAL HIGH (ref 65–99)
Glucose-Capillary: 107 mg/dL — ABNORMAL HIGH (ref 65–99)

## 2016-12-27 LAB — CBC WITH DIFFERENTIAL/PLATELET
BASOS PCT: 0 %
Basophils Absolute: 0 10*3/uL (ref 0.0–0.1)
EOS ABS: 0 10*3/uL (ref 0.0–0.7)
EOS PCT: 0 %
HCT: 24.8 % — ABNORMAL LOW (ref 36.0–46.0)
Hemoglobin: 8 g/dL — ABNORMAL LOW (ref 12.0–15.0)
LYMPHS ABS: 0.8 10*3/uL (ref 0.7–4.0)
Lymphocytes Relative: 6 %
MCH: 27.6 pg (ref 26.0–34.0)
MCHC: 32.3 g/dL (ref 30.0–36.0)
MCV: 85.5 fL (ref 78.0–100.0)
MONOS PCT: 6 %
Monocytes Absolute: 0.8 10*3/uL (ref 0.1–1.0)
NEUTROS PCT: 88 %
Neutro Abs: 12 10*3/uL — ABNORMAL HIGH (ref 1.7–7.7)
PLATELETS: 208 10*3/uL (ref 150–400)
RBC: 2.9 MIL/uL — ABNORMAL LOW (ref 3.87–5.11)
RDW: 14.4 % (ref 11.5–15.5)
WBC: 13.5 10*3/uL — ABNORMAL HIGH (ref 4.0–10.5)

## 2016-12-27 LAB — BASIC METABOLIC PANEL
Anion gap: 11 (ref 5–15)
BUN: 21 mg/dL — AB (ref 6–20)
CALCIUM: 9 mg/dL (ref 8.9–10.3)
CO2: 23 mmol/L (ref 22–32)
CREATININE: 0.7 mg/dL (ref 0.44–1.00)
Chloride: 108 mmol/L (ref 101–111)
GFR calc Af Amer: >60 mL/min (ref >60)
GLUCOSE: 118 mg/dL — AB (ref 65–99)
Potassium: 3.7 mmol/L (ref 3.5–5.1)
SODIUM: 142 mmol/L (ref 135–145)

## 2016-12-27 LAB — PHOSPHORUS: PHOSPHORUS: 2.4 mg/dL — AB (ref 2.5–4.6)

## 2016-12-27 MED ORDER — WHITE PETROLATUM GEL
Status: AC
  Filled 2016-12-27: qty 1

## 2016-12-27 MED ORDER — MIDAZOLAM HCL 2 MG/2ML IJ SOLN: 2.0000 mg | Freq: Once | INTRAMUSCULAR | Status: AC

## 2016-12-27 MED ORDER — MIDAZOLAM HCL 2 MG/2ML IJ SOLN
INTRAMUSCULAR | Status: AC
  Administered 2016-12-27: 2 mg
  Filled 2016-12-27: qty 2

## 2016-12-27 MED ORDER — WHITE PETROLATUM GEL
Status: AC
  Administered 2016-12-27: 13:00:00
  Filled 2016-12-27: qty 1

## 2016-12-27 NOTE — Progress Notes (Signed)
Social Note:  Socially rounded Brittany Christian today. Patient sleeping but wakes to voice. Appreciate the excellent care of neurosurgery and CCM. Will resume care when they determine patient is stable for the floor.   Smiley Houseman, MD PGY 2 Family Medicine

## 2016-12-27 NOTE — Progress Notes (Signed)
SLP Cancellation Note  Patient Details Name: RAYLINN KOSAR MRN: [id number] DOB: 1930-12-22   Cancelled treatment:       Reason Eval/Treat Not Completed: Fatigue/lethargy limiting ability to participate after discussion with RN.  Will continue efforts.   Juan Quam Laurice 12/27/2016, 3:45 PM

## 2016-12-27 NOTE — Progress Notes (Addendum)
Pt seen and examined. No issues overnight.  EXAM: Temp:  [97.7 F (36.5 C)-99.2 F (37.3 C)] 97.8 F (36.6 C) (04/01 0800) Pulse Rate:  [62-94] 87 (04/01 0800) Resp:  [7-26] 20 (04/01 0800) BP: (111-189)/(58-132) 165/64 (04/01 0800) SpO2:  [96 %-100 %] 100 % (04/01 0800) Intake/Output      03/31 0701 - 04/01 0700 04/01 0701 - 04/02 0700   P.O.     I.V. (mL/kg) 1800 (31.8)    IV Piggyback 210    Total Intake(mL/kg) 2010 (35.5)    Urine (mL/kg/hr) 475 (0.3)    Total Output 475     Net +1535           Awake and alert, confused Follows commands throughout Full strength Bandage c/d/i  Delirious Check UA D/c decadron

## 2016-12-28 DIAGNOSIS — I10 Essential (primary) hypertension: Secondary | ICD-10-CM

## 2016-12-28 LAB — CBC WITH DIFFERENTIAL/PLATELET
BASOS ABS: 0 10*3/uL (ref 0.0–0.1)
Basophils Relative: 0 %
EOS ABS: 0 10*3/uL (ref 0.0–0.7)
EOS PCT: 0 %
HCT: 25.2 % — ABNORMAL LOW (ref 36.0–46.0)
HEMOGLOBIN: 8 g/dL — AB (ref 12.0–15.0)
LYMPHS PCT: 19 %
Lymphs Abs: 1.8 10*3/uL (ref 0.7–4.0)
MCH: 27.1 pg (ref 26.0–34.0)
MCHC: 31.7 g/dL (ref 30.0–36.0)
MCV: 85.4 fL (ref 78.0–100.0)
Monocytes Absolute: 0.7 10*3/uL (ref 0.1–1.0)
Monocytes Relative: 8 %
NEUTROS PCT: 73 %
Neutro Abs: 6.8 10*3/uL (ref 1.7–7.7)
PLATELETS: 246 10*3/uL (ref 150–400)
RBC: 2.95 MIL/uL — ABNORMAL LOW (ref 3.87–5.11)
RDW: 14.3 % (ref 11.5–15.5)
WBC: 9.4 10*3/uL (ref 4.0–10.5)

## 2016-12-28 LAB — GLUCOSE, CAPILLARY
GLUCOSE-CAPILLARY: 97 mg/dL (ref 65–99)
Glucose-Capillary: 99 mg/dL (ref 65–99)
Glucose-Capillary: 116 mg/dL — ABNORMAL HIGH (ref 65–99)
Glucose-Capillary: 105 mg/dL — ABNORMAL HIGH (ref 65–99)
Glucose-Capillary: 84 mg/dL (ref 65–99)

## 2016-12-28 LAB — PHOSPHORUS: PHOSPHORUS: 2.6 mg/dL (ref 2.5–4.6)

## 2016-12-28 MED ORDER — ENSURE ENLIVE PO LIQD
237.0000 mL | Freq: Two times a day (BID) | ORAL | Status: DC
  Administered 2016-12-31 – 2017-01-01 (×2): 237 mL via ORAL

## 2016-12-28 NOTE — Progress Notes (Signed)
  Speech Language Pathology Treatment: Dysphagia  Patient Details Name: Brittany Christian MRN: [id number] DOB: [birth year] Today's Date: 12/28/2016 Time: 2563-8937 SLP Time Calculation (min) (ACUTE ONLY): 13 min  Assessment / Plan / Recommendation Clinical Impression  Pt remains easily distractible but is much more accepting of POs offered today. She consumed 8 oz of water with no overt s/s of aspiration, even with impulsive rate of intake. She needs Max cues for sustained attention to intake, but once she orally accepts pureed boluses she swallows them without overt signs of difficulty. Oral holding is not observed today. Given her current mentation, recommend Dys 1 diet and thin liquids under full supervision. Will continue to follow for tolerance. Recommend MD consider SLP cognitive-linguistic evaluation as well.   HPI HPI: Pt is an 83 y.o. female admitted on 12/23/16 after a reported 10-day history of altered mental status since a fall on 3/18. CT showed L SDH with midline shift. Pt now s/p L frontotemporal crani 3/29. Pertinent PMH includes HTN, dementia, cancer.       SLP Plan  Goals updated       Recommendations  Diet recommendations: Dysphagia 1 (puree);Thin liquid Liquids provided via: Straw Medication Administration: Crushed with puree Supervision: Staff to assist with self feeding;Full supervision/cueing for compensatory strategies Compensations: Minimize environmental distractions;Slow rate;Small sips/bites Postural Changes and/or Swallow Maneuvers: Seated upright 90 degrees                Oral Care Recommendations: Oral care BID Follow up Recommendations: Skilled Nursing facility SLP Visit Diagnosis: Dysphagia, unspecified (R13.10) Plan: Goals updated       GO                Brittany Christian 12/28/2016, 9:35 AM  Brittany Christian, M.A. CCC-SLP [phone]

## 2016-12-28 NOTE — Progress Notes (Signed)
Pt seen and examined. No issues overnight.  EXAM: Temp:  [97.8 F (36.6 C)-99.2 F (37.3 C)] 98.8 F (37.1 C) (04/02 1200) Pulse Rate:  [53-101] 74 (04/02 1200) Resp:  [7-26] 20 (04/02 1200) BP: (125-198)/(57-104) 153/89 (04/02 1200) SpO2:  [94 %-100 %] 100 % (04/02 1200) Intake/Output      04/01 0701 - 04/02 0700 04/02 0701 - 04/03 0700   P.O.  240   I.V. (mL/kg) 833.8 (14.7) 375 (6.6)   IV Piggyback 205 105   Total Intake(mL/kg) 1038.8 (18.4) 720 (12.7)   Urine (mL/kg/hr) 650 (0.5) 200 (0.6)   Total Output 650 200   Net +388.8 +520         Awake and alert, confusion improving Follows commands throughout Full strength  Neurologically improving Ok to transfer to floor Will ask Family Medicine to resume care

## 2016-12-28 NOTE — Progress Notes (Signed)
Nutrition Follow-up  DOCUMENTATION CODES:   Severe malnutrition in context of social or environmental circumstances  INTERVENTION:   Ensure Enlive po BID, each supplement provides 350 kcal and 20 grams of protein Magic cup TID with meals, each supplement provides 290 kcal and 9 grams of protein  NUTRITION DIAGNOSIS:   Malnutrition (Severe) related to social / environmental circumstances as evidenced by severe depletion of muscle mass, severe depletion of body fat. Ongoing.   GOAL:   Patient will meet greater than or equal to 90% of their needs Progressing.   MONITOR:   PO intake, Supplement acceptance, Diet advancement, I & O's  ASSESSMENT:   83 y/o F admitted 3/28 with AMS.  Found to have L 66mm SDH.  To OR 3/29 am for evacuation of hematoma.  Pt sleeping, family at bedside. Diet advanced this am. Per RN she did well eating when you could keep her attention.    Diet Order:  DIET - DYS 1 Room service appropriate? Yes; Fluid consistency: Thin  Skin:  Reviewed, no issues  Last BM:  3/29  Height:   Ht Readings from Last 1 Encounters:  12/24/16 5\' 8"  (1.727 m)    Weight:   Wt Readings from Last 1 Encounters:  12/24/16 124 lb 12.5 oz (56.6 kg)    Ideal Body Weight:  63.6 kg  BMI:  Body mass index is 18.97 kg/m.  Estimated Nutritional Needs:   Kcal:  1400-1600  Protein:  70-90 grams  Fluid:  > 1.5 L/day  EDUCATION NEEDS:   No education needs identified at this time  [place], [place], [place] Pager [phone] After Hours Pager

## 2016-12-28 NOTE — Progress Notes (Addendum)
qPhysical Therapy Treatment Patient Details Name: Brittany Christian MRN: [id number] DOB: July 03, 1931 Today's Date: 12/28/2016    History of Present Illness Pt is an 83 y.o. female admitted on 12/23/16 after a reported 10-day history of altered mental status since a fall on 3/18. CT showed L SDH with midline shift. Pt now s/p L frontotemporal crani 3/29. Pertinent PMH includes HTN, dementia, cancer.     PT Comments    Pt progressing with mobility, able to interact more with therapy today. Performed bed mobility and took steps to chair with maxA +2; BLE knee extension in standing, pt unable to achieve fully upright posture secondary to lack of hip ext. Pt continues to demonstrate confusion; limited by decreased attention span, decreased awareness, and vision impairments (??). Will continue to follow acutely.    Follow Up Recommendations  CIR;Supervision/Assistance - 24 hour     Equipment Recommendations  Other (comment) (Defer to next venue)    Recommendations for Other Services Rehab consult     Precautions / Restrictions Precautions Precautions: Fall Restrictions Weight Bearing Restrictions: No    Mobility  Bed Mobility Overal bed mobility: Needs Assistance Bed Mobility: Supine to Sit     Supine to sit: Max assist;+2 for physical assistance     General bed mobility comments: MaxA +2 to perform helicopter technique to EOB; pt easily distracted with all movement, repeatedly asking [place] to "wait, hold on"  Transfers Overall transfer level: Needs assistance Equipment used: 2 person hand held assist Transfers: Sit to/from Stand Sit to Stand: +2 physical assistance;Max assist   Squat pivot transfers: Total assist;+2 physical assistance;+2 safety/equipment     General transfer comment: Total A to transfer to chair with bilat HHA; squat pivot secondary to pt with complete BLE extension and significant posterior lean with transfer.   Ambulation/Gait Ambulation/Gait assistance: Max  assist;+2 physical assistance Ambulation Distance (Feet): 3 Feet Assistive device: 2 person hand held assist Gait Pattern/deviations: Step-to pattern     General Gait Details: Able to take steps to bedside chair with maxA +2. BLE locked into knee extension, requiring assist to move LLE; pt unable to achieve full upright secondary to decreased hip ext.    Stairs            Wheelchair Mobility    Modified Rankin (Stroke Patients Only) Modified Rankin (Stroke Patients Only) Pre-Morbid Rankin Score: No symptoms Modified Rankin: Moderately severe disability     Balance Overall balance assessment: Needs assistance Sitting-balance support: No upper extremity supported;Feet supported Sitting balance-Leahy Scale: Fair Sitting balance - Comments: Significant posterior lean with pt resisting assist to sit upright Postural control: Posterior lean Standing balance support: Bilateral upper extremity supported Standing balance-Leahy Scale: Zero Standing balance comment: Pt resisting standing fully upright, reliant on max A +2 for standing.                             Cognition Arousal/Alertness: Awake/alert Behavior During Therapy: WFL for tasks assessed/performed Overall Cognitive Status: Impaired/Different from baseline Area of Impairment: Following commands;Attention;Problem solving;Safety/judgement;Awareness;Memory                   Current Attention Level: Focused Memory: Decreased short-term memory Following Commands: Follows one step commands inconsistently Safety/Judgement: Decreased awareness of deficits;Decreased awareness of safety Awareness: Intellectual Problem Solving: Slow processing;Requires verbal cues General Comments: pt at time with word finding deficits. pt repeating same statements back to therapist. Pt reports bear as a unintelligible word and  color as white and bear is "pink in color. Pt working with SLP prior to therapist arrival and  perseverating on drinking and attempting to drink green mitten. pt does not seem startled by lack of liquids from mitten and continues even with cues from therapist. pt startles and question lack of attention and visual deficits.        Exercises      General Comments        Pertinent Vitals/Pain Pain Assessment: No/denies pain Faces Pain Scale: No hurt    Home Living Family/patient expects to be discharged to:: Private residence Living Arrangements: Spouse/significant other Available Help at Discharge: Family;Available 24 hours/day (Children live nearby) Type of Home: House         Additional Comments: poor historian and repeating statements that therapist stated    Prior Function Level of Independence: Independent      Comments: Indep with all functional mobility; stopped driving in November. Cared for husband at home; cooks, cleans, and performs all ADLs.    PT Goals (current goals can now be found in the care plan section) Acute Rehab PT Goals Patient Stated Goal: Return home PT Goal Formulation: With patient Time For Goal Achievement: 01/11/17 Progress towards PT goals: Progressing toward goals    Frequency    Min 3X/week      PT Plan Discharge plan needs to be updated    Co-evaluation   Reason for Co-Treatment: Complexity of the patient's impairments (multi-system involvement);Necessary to address cognition/behavior during functional activity;For patient/therapist safety;To address functional/ADL transfers   OT goals addressed during session: ADL's and self-care;Strengthening/ROM;Proper use of Adaptive equipment and DME     End of Session Equipment Utilized During Treatment: Gait belt Activity Tolerance: Patient tolerated treatment well;Other (comment) (Limited by confusion, increased distraction) Patient left: in chair;with call bell/phone within reach;with chair alarm set Nurse Communication: Mobility status PT Visit Diagnosis: Other abnormalities of  gait and mobility (R26.89)     Time: 4098-1191 PT Time Calculation (min) (ACUTE ONLY): 17 min  Charges:  $Therapeutic Activity: 8-22 mins                    G Codes:      Enis Gash, SPT [phone]  Mabeline Caras 12/28/2016, 2:08 PM

## 2016-12-28 NOTE — Evaluation (Signed)
Occupational Therapy Evaluation Patient Details Name: Brittany Christian MRN: [id number] DOB: [birth year] Today's Date: 12/28/2016    History of Present Illness Pt is an 83 y.o. female admitted on 12/23/16 after a reported 10-day history of altered mental status since a fall on 3/18. CT showed L SDH with midline shift. Pt now s/p L frontotemporal crani 3/29. Pertinent PMH includes HTN, dementia, cancer.    Clinical Impression   Patient is s/p L SDH with L frontotemporal crainotomy surgery resulting in functional limitations due to the deficits listed below (see OT problem list). PTA was independent with adls. Patient will benefit from skilled OT acutely to increase independence and safety with ADLS to allow discharge CIR.     Follow Up Recommendations  CIR    Equipment Recommendations  Other (comment) (TBA)    Recommendations for Other Services Rehab consult     Precautions / Restrictions Precautions Precautions: Fall Restrictions Weight Bearing Restrictions: No      Mobility Bed Mobility Overal bed mobility: Needs Assistance Bed Mobility: Supine to Sit     Supine to sit: Max assist;+2 for physical assistance     General bed mobility comments: MaxA +2 to perform helicopter technique to EOB; pt easily distracted with all movement, repeatedly asking [place] to "wait, hold on"  Transfers Overall transfer level: Needs assistance Equipment used: 2 person hand held assist Transfers: Sit to/from Stand Sit to Stand: +2 physical assistance;Max assist   Squat pivot transfers: Total assist;+2 physical assistance;+2 safety/equipment     General transfer comment: Total A to transfer to chair with bilat HHA; squat pivot secondary to pt with complete BLE extension and significant posterior lean with transfer.     Balance Overall balance assessment: Needs assistance Sitting-balance support: No upper extremity supported;Feet supported Sitting balance-Leahy Scale: Fair Sitting balance -  Comments: Significant posterior lean with pt resisting assist to sit upright Postural control: Posterior lean Standing balance support: Bilateral upper extremity supported Standing balance-Leahy Scale: Zero Standing balance comment: Pt resisting standing fully upright, reliant on max A +2 for standing.                            ADL either performed or assessed with clinical judgement   ADL Overall ADL's : Needs assistance/impaired   Eating/Feeding Details (indicate cue type and reason): see SLP note Grooming: Wash/dry face;Maximal assistance Grooming Details (indicate cue type and reason): hand over hand Upper Body Bathing: Maximal assistance   Lower Body Bathing: Total assistance       Lower Body Dressing: Total assistance Lower Body Dressing Details (indicate cue type and reason): pt startles with don of socks Toilet Transfer: Maximal assistance;+2 for safety/equipment;+2 for physical assistance;Stand-pivot             General ADL Comments: pt needs (A) to initiate task and to maintain attention to task. Quiet environment and one person talking helps with patients attention      Vision   Additional Comments: reports diplopia in the L visual field. Pt with L eye lid with ptosis with edema (question from incision site)      Perception     Praxis      Pertinent Vitals/Pain Pain Assessment: No/denies pain Faces Pain Scale: No hurt     Hand Dominance Right   Extremity/Trunk Assessment Upper Extremity Assessment Upper Extremity Assessment: Generalized weakness   Lower Extremity Assessment Lower Extremity Assessment: Defer to PT evaluation   Cervical / Trunk Assessment  Cervical / Trunk Assessment: Normal (Core strength >3/5 based on functional tasks and pt resisting all movements)   Communication Communication Communication: Other (comment) (Pt only occasionally opening her eyes; voiced incomprehensible words 1x)   Cognition Arousal/Alertness:  Awake/alert Behavior During Therapy: WFL for tasks assessed/performed Overall Cognitive Status: Impaired/Different from baseline Area of Impairment: Following commands;Attention;Problem solving;Safety/judgement;Awareness;Memory                   Current Attention Level: Focused Memory: Decreased short-term memory Following Commands: Follows one step commands inconsistently Safety/Judgement: Decreased awareness of deficits;Decreased awareness of safety Awareness: Intellectual Problem Solving: Slow processing;Requires verbal cues General Comments: pt at time with word finding deficits. pt repeating same statements back to therapist. Pt reports bear as a unintelligible word and color as white and bear is "pink in color. Pt working with SLP prior to therapist arrival and perseverating on drinking and attempting to drink green mitten. pt does not seem startled by lack of liquids from mitten and continues even with cues from therapist. pt startles and question lack of attention and visual deficits.     General Comments       Exercises     Shoulder Instructions      Home Living Family/patient expects to be discharged to:: Private residence Living Arrangements: Spouse/significant other Available Help at Discharge: Family;Available 24 hours/day (Children live nearby) Type of Home: House                           Additional Comments: poor historian and repeating statements that therapist stated      Prior Functioning/Environment Level of Independence: Independent        Comments: Indep with all functional mobility; stopped driving in November. Cared for husband at home; cooks, cleans, and performs all ADLs.         OT Problem List: Decreased strength;Decreased activity tolerance;Impaired balance (sitting and/or standing);Decreased safety awareness;Decreased knowledge of use of DME or AE;Decreased knowledge of precautions;Decreased cognition;Impaired  vision/perception      OT Treatment/Interventions: Self-care/ADL training;Therapeutic exercise;DME and/or AE instruction;Therapeutic activities;Balance training;Patient/family education;Visual/perceptual remediation/compensation;Cognitive remediation/compensation;Energy conservation    OT Goals(Current goals can be found in the care plan section) Acute Rehab OT Goals Patient Stated Goal: Return home OT Goal Formulation: Patient unable to participate in goal setting Time For Goal Achievement: 01/11/17 Potential to Achieve Goals: Good  OT Frequency: Min 2X/week   Barriers to D/C:            Co-evaluation PT/OT/SLP Co-Evaluation/Treatment: Yes Reason for Co-Treatment: Complexity of the patient's impairments (multi-system involvement);Necessary to address cognition/behavior during functional activity;For patient/therapist safety;To address functional/ADL transfers PT goals addressed during session: Mobility/safety with mobility;Balance OT goals addressed during session: ADL's and self-care;Strengthening/ROM;Proper use of Adaptive equipment and DME      End of Session Equipment Utilized During Treatment: Gait belt Nurse Communication: Mobility status;Precautions  Activity Tolerance: Patient tolerated treatment well Patient left: in chair;with call bell/phone within reach;with chair alarm set  OT Visit Diagnosis: Unsteadiness on feet (R26.81)                Time: 4742-5956 OT Time Calculation (min): 17 min Charges:  OT General Charges $OT Visit: 1 Procedure OT Evaluation $OT Eval Moderate Complexity: 1 Procedure G-Codes:      Jeri Modena   OTR/L Pager: [phone] Office: [phone] .   Parke Poisson B 12/28/2016, 11:24 AM

## 2016-12-28 NOTE — Progress Notes (Signed)
Social Note:  Socially rounded Brittany Christian today. Appreciate the excellent care of neurosurgery and CCM. Will resume care when they determine patient is stable for the floor.   Bufford Lope, DO PGY 1 Family Medicine

## 2016-12-28 NOTE — Progress Notes (Signed)
[facility] Attending Note  Presenting HPI:  83 y.o. female who presented 3/28 to [facility] with a 10 day history of altered mental status, poor PO intake, difficulty walking and lower extremity swelling. On admit, family reported they found her on the floor incontinent but it was an unwitnessed event.  Prior to episode, the patient was reportedly able to independently perform ADL's.  CT of the head was assessed which demonstrated a 19 mm left subdural hematoma thought to be subacute to chronic with a 13 mm midline right shift, cerebral atrophy with progressive dilation of the right temporal horn possibly due to obstruction of the right lateral ventricle. She was admitted by FPTS and evaluated by Neurosurgery.  The patient underwent left craniotomy on 3/28 per Dr. Cyndy Freeze for evacuation of subdural hematoma.  She was extubated and returned to ICU. PCCM consulted for medical management in ICU.   Subjective:  No acute events overnight. Patient continues to have somewhat fluctuating mental status.  Review of Systems:  Unable to obtain given altered mentation.   Temp:  [97.8 F (36.6 C)-98.4 F (36.9 C)] 98.4 F (36.9 C) (04/02 0400) Pulse Rate:  [53-101] 58 (04/02 0700) Resp:  [7-26] 8 (04/02 0700) BP: (125-198)/(57-126) 162/61 (04/02 0700) SpO2:  [94 %-100 %] 98 % (04/02 0700)  General:  No family at bedside. Awake. Speech therapist at bedside. No acute distress. Integument:  Warm & dry. No rash on exposed skin. HEENT:  Tacky mucus memebranes. No scleral icterus.  Neurological:  Pupils symmetric. Spontaneously moving all 4 extremities. Follows some commands. Musculoskeletal:  No joint effusion or erythema appreciated. Symmetric muscle bulk. Pulmonary:  Clear to auscultation. No accessory muscle use on room air. Cardiovascular:  Regular rate & rhythm. No appreciable JVD. Normal S1 & S2. Telemetry:  Sinus rhythm. Abdomen:  Soft. Nondistended. Normoactive bowel  sounds.  LINES/TUBES: Foley >>> PIV  CBC Latest Ref Rng & Units 12/27/2016 12/26/2016 12/25/2016  WBC 4.0 - 10.5 K/uL 13.5(H) 17.0(H) 14.8(H)  Hemoglobin 12.0 - 15.0 g/dL 8.0(L) 8.1(L) 8.8(L)  Hematocrit 36.0 - 46.0 % 24.8(L) 25.7(L) 27.3(L)  Platelets 150 - 400 K/uL 208 196 183   BMP Latest Ref Rng & Units 12/27/2016 12/26/2016 12/25/2016  Glucose 65 - 99 mg/dL 118(H) 131(H) 110(H)  BUN 6 - 20 mg/dL 21(H) 22(H) 28(H)  Creatinine 0.44 - 1.00 mg/dL 0.70 0.72 0.90  Sodium 135 - 145 mmol/L 142 145 142  Potassium 3.5 - 5.1 mmol/L 3.7 4.1 3.8  Chloride 101 - 111 mmol/L 108 108 106  CO2 22 - 32 mmol/L _0 Calcium 8.9 - 10.3 mg/dL 9.0 9.3 9.3   Hepatic Function Latest Ref Rng & Units 12/23/2016 05/10/2016 12/13/2013  Total Protein 6.5 - 8.1 g/dL 6.7 7.0 8.1  Albumin 3.5 - 5.0 g/dL 3.6 4.0 4.0  AST 15 - 41 U/L 37 19 32  ALT 14 - 54 U/L 13(L) 11(L) 19  Alk Phosphatase 38 - 126 U/L 41 42 58  Total Bilirubin 0.3 - 1.2 mg/dL 1.7(H) 0.8 0.6    IMAGING/STUDIES: CT HEAD W/O 3/28:  19 mm left subdural hematoma. The fluid is relatively low density suggesting this is a subacute to chronic subdural hematoma. 13 mm Midline shift to the right. Cerebral atrophy with progressive dilatation of the right temporal horn possibly due to obstruction of the right lateral ventricle. CT HEAD W/O 3/30: 1. Interval left-sided craniotomy with pneumocephalus and low-attenuation epidural collection subjacent to the craniotomy flap. Decreased size of subdural  hematoma. 2. Decreased mass effect with 10 mm of residual left-to-right midline shift and persistent entrapment of the right lateral ventricle. 3. No evidence for new acute brain parenchymal hemorrhage or large territory infarct.  MICROBIOLOGY: MRSA PCR 3/29:  Negative   ANTIBIOTICS: None.  SIGNIFICANT EVENTS: 03/28 - Admit 03/29 - Craniotomy by Dr. Cyndy Freeze   ASSESSMENT/PLAN:  83 y.o. female with history of essential hypertension with somewhat fluctuating  mental status likely due to some element of delirium. Patient is mildly hypertensive but currently undergoing speech evaluation.  1. Left subdural hematoma: Status post craniotomy. Management per neurosurgery. Continuing Keppra IV every 12 hours for a total of 7 days. 2. Acute encephalopathy: Questionable mild baseline dementia. Continuing to limit sedating medications.  3. Essential hypertension: Monitoring vitals per unit protocol. Continuing verapamil once able to tolerate oral diet. 4. Acute renal failure: Resolved with IV fluid resuscitation. Monitor electrolytes daily.  5. Hyperglycemia: Likely secondary to steroids. Glucose now controlled. Continuing Accu-Cheks every 4 hours with sliding scale insulin per moderate algorithm. 6. Anemia:  No signs of active bleeding. Likely due to [place] somewhat. Monitoring cell counts daily w/ CBC.  Prophylaxis:  SCDs & Protonix IV qhs.  Diet:  NPO except ice chips. Speech evaluation pending. Code Status: Full code status per previous physician discussions. Disposition:  Patient can likely transfer out of the ICU once Neurosurgery feels this is appropriate.  Family Update:  No family at bedside during rounds.  Sonia Baller Ashok Cordia, M.D. [facility] Pager:  [phone] After 3pm or if no response, call [phone] 7:17 AM 12/28/16

## 2016-12-28 NOTE — Progress Notes (Signed)
Inpatient Rehabilitation  Per OT and PT request, patient was screened by Marly Schuld for appropriateness for an Inpatient Acute Rehab consult.  At this time we are recommending an Inpatient Rehab consult.  Please order if you are agreeable.    Veverly Larimer, M.A., CCC/SLP Admission Coordinator   Inpatient Rehabilitation  Cell [phone]  

## 2016-12-29 LAB — CBC WITH DIFFERENTIAL/PLATELET
Basophils Absolute: 0 10*3/uL (ref 0.0–0.1)
Basophils Relative: 0 %
EOS PCT: 1 %
Eosinophils Absolute: 0.1 10*3/uL (ref 0.0–0.7)
HCT: 22.5 % — ABNORMAL LOW (ref 36.0–46.0)
HEMOGLOBIN: 7.2 g/dL — AB (ref 12.0–15.0)
LYMPHS ABS: 1.3 10*3/uL (ref 0.7–4.0)
LYMPHS PCT: 21 %
MCH: 27.2 pg (ref 26.0–34.0)
MCHC: 32 g/dL (ref 30.0–36.0)
MCV: 84.9 fL (ref 78.0–100.0)
MONOS PCT: 10 %
Monocytes Absolute: 0.6 10*3/uL (ref 0.1–1.0)
NEUTROS PCT: 68 %
Neutro Abs: 4.2 10*3/uL (ref 1.7–7.7)
Platelets: 275 10*3/uL (ref 150–400)
RBC: 2.65 MIL/uL — AB (ref 3.87–5.11)
RDW: 13.9 % (ref 11.5–15.5)
WBC: 6.2 10*3/uL (ref 4.0–10.5)

## 2016-12-29 LAB — RENAL FUNCTION PANEL
Albumin: 2.4 g/dL — ABNORMAL LOW (ref 3.5–5.0)
Anion gap: 10 (ref 5–15)
BUN: 14 mg/dL (ref 6–20)
CHLORIDE: 108 mmol/L (ref 101–111)
CO2: 24 mmol/L (ref 22–32)
Calcium: 8.7 mg/dL — ABNORMAL LOW (ref 8.9–10.3)
Creatinine, Ser: 0.57 mg/dL (ref 0.44–1.00)
GFR calc non Af Amer: >60 mL/min (ref >60)
Glucose, Bld: 81 mg/dL (ref 65–99)
Phosphorus: 2.5 mg/dL (ref 2.5–4.6)
Potassium: 3.2 mmol/L — ABNORMAL LOW (ref 3.5–5.1)
Sodium: 142 mmol/L (ref 135–145)

## 2016-12-29 LAB — GLUCOSE, CAPILLARY
GLUCOSE-CAPILLARY: 79 mg/dL (ref 65–99)
GLUCOSE-CAPILLARY: 86 mg/dL (ref 65–99)
Glucose-Capillary: 88 mg/dL (ref 65–99)
Glucose-Capillary: 82 mg/dL (ref 65–99)
Glucose-Capillary: 91 mg/dL (ref 65–99)
Glucose-Capillary: 88 mg/dL (ref 65–99)

## 2016-12-29 LAB — MAGNESIUM: Magnesium: 1.6 mg/dL — ABNORMAL LOW (ref 1.7–2.4)

## 2016-12-29 NOTE — [place] FL2 (Signed)
[place] MEDICAID FL2 LEVEL OF CARE SCREENING TOOL     IDENTIFICATION  Patient Name: Brittany Christian Birthdate: 10/24/1930 Sex: female Admission Date (Current Location): 12/23/2016  [facility] and [place] Number:  Engineering geologist and Address:  [facility], [place] [address] [place], [place] [address]      Provider Number: [id number]  Attending Physician Name and Address:  Kevan Ny Ditty, MD  Relative Name and Phone Number:  Azell Der - [phone]    Current Level of Care: Hospital Recommended Level of Care: [place] Prior Approval Number:    Date Approved/Denied:   PASRR Number: [id number]  Discharge Plan: SNF    Current Diagnoses: Patient Active Problem List   Diagnosis Date Noted  . Acute respiratory failure with hypoxia ([place])   . Hypomagnesemia   . Subdural hematoma ([place]) 12/24/2016  . Altered mental status   . SDH (subdural hematoma) ([place])   . AKI (acute kidney injury) ([place])   . Hypertensive urgency 05/10/2016    Orientation RESPIRATION BLADDER Height & Weight     Self, Place  Normal Incontinent, Indwelling catheter (Foley Cath placed for urinary retention) Weight: 56.6 kg (124 lb 12.5 oz) Height:  5\' 8"  (172.7 cm)  BEHAVIORAL SYMPTOMS/MOOD NEUROLOGICAL BOWEL NUTRITION STATUS      Continent Diet (DYS 1)  AMBULATORY STATUS COMMUNICATION OF NEEDS Skin   Extensive Assist (patient is a 2+ person hand held assist) Verbally Surgical wounds                       Personal Care Assistance Level of Assistance  Bathing, Feeding, Dressing Bathing Assistance: Maximum assistance Feeding assistance: Maximum assistance Dressing Assistance: Maximum assistance     Functional Limitations Info  Sight, Hearing, Speech Sight Info: Impaired Hearing Info: Adequate Speech Info: Impaired (Incomprehensible words)    SPECIAL CARE FACTORS FREQUENCY  PT (By licensed PT), OT (By licensed OT), Speech therapy      PT Frequency: PT Eval completed on 3/30/218 with recommendation of 3x/week minimum OT Frequency: OT Eval completed on 12/28/2016 with recommendation of 2x/week minimum     Speech Therapy Frequency: SLP Eval completed on 12/15/2016 with recommendation of 2x/week for 2 weeks minimum      Contractures Contractures Info: Not present    Additional Factors Info  Code Status, Allergies Code Status Info: Full Code Allergies Info: No Known Allergies           Current Medications (12/29/2016):  This is the current hospital active medication list Current Facility-Administered Medications  Medication Dose Route Frequency Provider Last Rate Last Dose  . 0.9 %  sodium chloride infusion   Intravenous Continuous Fredia Sorrow, MD 75 mL/hr at 12/28/16 [phone]    . 0.9 %  sodium chloride infusion   Intravenous Continuous Traci Sermon, PA-C   Stopped at 12/24/16 2000  . acetaminophen (TYLENOL) tablet 650 mg  650 mg Oral Q4H PRN Vista Mink Costella, PA-C       Or  . acetaminophen (TYLENOL) suppository 650 mg  650 mg Rectal Q4H PRN Vincent J Costella, PA-C      . bisacodyl (DULCOLAX) suppository 10 mg  10 mg Rectal Daily PRN Vista Mink Costella, PA-C      . docusate sodium (COLACE) capsule 100 mg  100 mg Oral BID Vista Mink Costella, PA-C   100 mg at 12/28/16 2205  . feeding supplement (ENSURE ENLIVE) (ENSURE ENLIVE) liquid 237 mL  237  mL Oral BID BM Kevan Ny Ditty, MD      . HYDROcodone-acetaminophen (NORCO/VICODIN) 5-325 MG per tablet 1 tablet  1 tablet Oral Q4H PRN Traci Sermon, PA-C   1 tablet at 12/29/16 1149  . HYDROmorphone (DILAUDID) injection 0.5-1 mg  0.5-1 mg Intravenous Q2H PRN Traci Sermon, PA-C   1 mg at 12/28/16 0217  . insulin aspart (novoLOG) injection 0-15 Units  0-15 Units Subcutaneous Q4H Donita Brooks, NP   2 Units at 12/27/16 0416  . labetalol (NORMODYNE,TRANDATE) injection 10-40 mg  10-40 mg Intravenous Q10 min PRN Vista Mink Costella, PA-C   40 mg at 12/28/16  1412  . levETIRAcetam (KEPPRA) 500 mg in sodium chloride 0.9 % 100 mL IVPB  500 mg Intravenous Q12H Vincent J Costella, PA-C   500 mg at 12/29/16 0901  . naloxone [facility]) injection 0.08 mg  0.08 mg Intravenous PRN Vista Mink Costella, PA-C      . ondansetron (ZOFRAN) tablet 4 mg  4 mg Oral Q4H PRN Vincent J Costella, PA-C       Or  . ondansetron (ZOFRAN) injection 4 mg  4 mg Intravenous Q4H PRN Vincent J Costella, PA-C      . pantoprazole (PROTONIX) injection 40 mg  40 mg Intravenous QHS Vista Mink Costella, PA-C   40 mg at 12/28/16 2205  . polyethylene glycol (MIRALAX / GLYCOLAX) packet 17 g  17 g Oral Daily PRN Vincent J Costella, PA-C      . promethazine (PHENERGAN) tablet 12.5-25 mg  12.5-25 mg Oral Q4H PRN Vincent J Costella, PA-C      . sodium phosphate (FLEET) 7-19 GM/118ML enema 1 enema  1 enema Rectal Once PRN Vincent J Costella, PA-C      . verapamil (CALAN) tablet 80 mg  80 mg Oral Q8H Sela Hilding, MD   80 mg at 12/29/16 0605     Discharge Medications: Please see discharge summary for a list of discharge medications.  Relevant Imaging Results:  Relevant Lab Results:   Additional Information SSN:  [id number]  Lajoyce Lauber Work [phone]

## 2016-12-29 NOTE — Progress Notes (Signed)
Attempted to ambulate pt in chair.  She refused,.  Ave Filter, RN

## 2016-12-29 NOTE — Consult Note (Signed)
[facility] CM Primary Care Navigator  12/29/2016  SHATONA ANDUJAR 11-Aug-1931 [id number]   Met with patient, husband Collier Salina), daughter Mariann Laster), sons Dallas Breeding and Rikki Spearing.) at the bedside to identify possible discharge needs. According to patient's children, patient was noted having deterioration and change in mental status (not eating much, sleeping more and disorientation) which had led to this admission/ surgery.   Patient and husband had been living with son- Dallas Breeding who serves as the primary caregiver. Patient's son endorsed Dr. Lisette Grinder with [facility] as the primary care provider.  Patient was using [facility] in [place] to obtain medications without difficulty.  Son reports that patient was managing her own medications at home, however, he will be assisting patient after discharge.  Patient's daughter had been providing transportation to her doctors' appointments.  Discharge plan is skilled nursing facility placement-(still in process) per therapy recommendation  for rehabilitation (prefers [facility] per daughter).  Patient's children voiced understanding to call primary care provider's office when patient returns back home, for a post discharge follow-up appointment within a week or sooner if needs arise. Patient letter (with PCP's contact number) was provided as a reminder.  Patient's family denies any further needs or concerns at this time.   For questions, please contact:  Dannielle Huh, BSN, RN- [facility] Primary Care Navigator  Telephone: [phone] [place]

## 2016-12-29 NOTE — Progress Notes (Signed)
Pt seen and examined.  No issues overnight.  EXAM: Temp:  [97.8 F (36.6 C)-99.2 F (37.3 C)] 98 F (36.7 C) (04/03 0424) Pulse Rate:  [50-74] 54 (04/03 0424) Resp:  [8-20] 16 (04/03 0424) BP: (136-176)/(55-104) 142/77 (04/03 0424) SpO2:  [96 %-100 %] 100 % (04/03 0424) Intake/Output      04/02 0701 - 04/03 0700 04/03 0701 - 04/04 0700   P.O. 240    I.V. (mL/kg) 600 (10.6)    IV Piggyback 105    Total Intake(mL/kg) 945 (16.7)    Urine (mL/kg/hr) 1350 (1)    Total Output 1350     Net -405           Awake Coherent for first several minutes upon awakening, but became confused shortly after Was initially following commands, but then stopped MAE Wound c/d/i   Stable Continue current care

## 2016-12-29 NOTE — Progress Notes (Signed)
qPhysical Therapy Treatment Patient Details Name: Brittany Christian MRN: [id number] DOB: [birth year] Today's Date: 12/29/2016    History of Present Illness Pt is an 83 y.o. female admitted on 12/23/16 after a reported 10-day history of altered mental status since a fall on 3/18. CT showed L SDH with midline shift. Pt now s/p L frontotemporal crani 3/29. Pertinent PMH includes HTN, dementia, cancer.     PT Comments    Patient making slow improvements with mobility.  Will benefit from CIR at d/c for continued therapy.   Follow Up Recommendations  CIR;Supervision/Assistance - 24 hour     Equipment Recommendations  Other (comment) (TBD in next venue)    Recommendations for Other Services Rehab consult     Precautions / Restrictions Precautions Precautions: Fall Restrictions Weight Bearing Restrictions: No    Mobility  Bed Mobility               General bed mobility comments: Patient in chair  Transfers Overall transfer level: Needs assistance Equipment used: 2 person hand held assist (Refused to use RW) Transfers: Sit to/from Stand Sit to Stand: Max assist;+2 physical assistance         General transfer comment: Much encouragement to attempt to stand.  Required +2 max assist, but patient unable to reach fully upright position with 2 attempts.  Ambulation/Gait             General Gait Details: Unable   Stairs            Wheelchair Mobility    Modified Rankin (Stroke Patients Only)       Balance Overall balance assessment: Needs assistance         Standing balance support: Bilateral upper extremity supported Standing balance-Leahy Scale: Zero Standing balance comment: Unable to reach fully upright standing position with +2 max assist                            Cognition Arousal/Alertness: Awake/alert Behavior During Therapy: [facility] for tasks assessed/performed;Restless Overall Cognitive Status: Impaired/Different from baseline Area  of Impairment: Following commands;Attention;Problem solving;Safety/judgement;Awareness;Memory                   Current Attention Level: Focused Memory: Decreased short-term memory Following Commands: Follows one step commands inconsistently Safety/Judgement: Decreased awareness of deficits;Decreased awareness of safety Awareness: Intellectual Problem Solving: Slow processing;Requires verbal cues General Comments: Patient sitting in chair - sliding to bottom of chair on her side.  Not aware of safety issue of falling off of chair.  Patient restless, fidgeting with lines.  Required max assist to participate with PT, with encouragement of daughter and brother.        Exercises      General Comments        Pertinent Vitals/Pain Pain Assessment: No/denies pain Faces Pain Scale: No hurt    Home Living                      Prior Function            PT Goals (current goals can now be found in the care plan section) Acute Rehab PT Goals Patient Stated Goal: Return home Progress towards PT goals: Progressing toward goals    Frequency    Min 3X/week      PT Plan Current plan remains appropriate    Co-evaluation             End of Session  Equipment Utilized During Treatment: Gait belt Activity Tolerance: Patient tolerated treatment well (Limited by confusion) Patient left: in chair;with call bell/phone within reach;with chair alarm set;with family/visitor present Nurse Communication: Mobility status (Up in chair with alarm) PT Visit Diagnosis: Other abnormalities of gait and mobility (R26.89);Unsteadiness on feet (R26.81)     Time: 1417-1440 PT Time Calculation (min) (ACUTE ONLY): 23 min  Charges:  $Therapeutic Activity: 23-37 mins                    G Codes:       Carita Pian. Sanjuana Kava, [facility] Acute Rehab Services Pager [place] 12/29/2016, 3:14 PM

## 2016-12-29 NOTE — Clinical Social Work Placement (Signed)
   CLINICAL SOCIAL WORK PLACEMENT  NOTE  Date:  12/29/2016  Patient Details  Name: Brittany Christian MRN: [id number] Date of Birth: Jan 09, 1931  Clinical Social Work is seeking post-discharge placement for this patient at the [place] level of care (*CSW will initial, date and re-position this form in  chart as items are completed):  Yes   Patient/family provided with [place] Work Department's list of facilities offering this level of care within the geographic area requested by the patient (or if unable, by the patient's family).  Yes   Patient/family informed of their freedom to choose among providers that offer the needed level of care, that participate in Medicare, Medicaid or managed care program needed by the patient, have an available bed and are willing to accept the patient.  Yes   Patient/family informed of [place]'s ownership interest in [facility] and [facility], as well as of the fact that they are under no obligation to receive care at these facilities.  PASRR submitted to EDS on 12/29/16     PASRR number received on 12/29/16     Existing PASRR number confirmed on       FL2 transmitted to all facilities in geographic area requested by pt/family on       FL2 transmitted to all facilities within larger geographic area on       Patient informed that his/her managed care company has contracts with or will negotiate with certain facilities, including the following:        Yes   Patient/family informed of bed offers received.  Patient chooses bed at [facility]     Physician recommends and patient chooses bed at      Patient to be transferred to [facility] on  .  Patient to be transferred to facility by [facility]AR     Patient family notified on   of transfer.  Name of family member notified:        PHYSICIAN       Additional Comment:    _______________________________________________ Linward Headland, [place] Work 12/29/2016, 4:21 PM

## 2016-12-29 NOTE — Progress Notes (Signed)
No acute issues Neurologically stable Wound looks good Will follow from afar; feel free to call with questions

## 2016-12-29 NOTE — Progress Notes (Signed)
  Speech Language Pathology Treatment: Dysphagia  Patient Details Name: Brittany Christian MRN: [id number] DOB: [birth year] Today's Date: 12/29/2016 Time: 8413-2440 SLP Time Calculation (min) (ACUTE ONLY): 15 min  Assessment / Plan / Recommendation Clinical Impression  Pt consumed thin liquids and pureed solids with swift oral clearance and no overt s/s of aspiration. She politely declines any more than 4 spoonfuls of food though, and her daughter reports that she has not taken more than a few bites of food in all day. Pt does not have her dentures here, and she and her daughter share that she does not eat more solid textures without them. Her daughter attempted to call another family member to have them brought to the hospital. Recommend to continue Dys 1 diet and thin liquids for now, with potential for advanced trials once dentures are available.    HPI HPI: Pt is an 83 y.o. female admitted on 12/23/16 after a reported 10-day history of altered mental status since a fall on 3/18. CT showed L SDH with midline shift. Pt now s/p L frontotemporal crani 3/29. Pertinent PMH includes HTN, dementia, cancer.       SLP Plan  Continue with current plan of care       Recommendations  Diet recommendations: Dysphagia 1 (puree);Thin liquid Liquids provided via: Straw Medication Administration: Crushed with puree Supervision: Staff to assist with self feeding;Full supervision/cueing for compensatory strategies Compensations: Minimize environmental distractions;Slow rate;Small sips/bites Postural Changes and/or Swallow Maneuvers: Seated upright 90 degrees                Oral Care Recommendations: Oral care BID Follow up Recommendations: Skilled Nursing facility SLP Visit Diagnosis: Dysphagia, unspecified (R13.10) Plan: Continue with current plan of care       GO                Germain Osgood 12/29/2016, 1:54 PM  Germain Osgood, M.A. CCC-SLP [phone]

## 2016-12-29 NOTE — Clinical Social Work Note (Signed)
Clinical Social Work Assessment  Patient Details  Name: Brittany Christian MRN: [id number] Date of Birth: 09/18/1931  Date of referral:  12/29/16               Reason for consult:  Facility Placement                Permission sought to share information with:  Family Supports Permission granted to share information::  Yes, Verbal Permission Granted  Name::     Brittany Christian  Agency::     Relationship::  Daughter  Contact Information:  [phone]  Housing/Transportation Living arrangements for the past 2 months:  Single Family Home Source of Information:  Adult Children Patient Interpreter Needed:  None Criminal Activity/Legal Involvement Pertinent to Current Situation/Hospitalization:  No - Comment as needed Significant Relationships:  Adult Children, Spouse Lives with:  Spouse Do you feel safe going back to the place where you live?  No Need for family participation in patient care:  Yes (Comment)  Care giving concerns:  Patient's daughter Brittany Christian is agreeable to her mother going to short term rehab after hospital discharge.    Social Worker assessment / plan:  CSW Intern spoke with patient's daughter Brittany Christian about her mother's discharge plans.  Patient was alert, sitting in chair and did not engage in the conversation.  Mariann Laster stated that she feels that her mother will need short term rehab after hospital discharge and would like for her to go to [facility]. CSW Intern explained the facility search process to Ms. McBroom and encourage her to choose a backup facility in the event that [facility] is unable to accept the patient.  Patient's daughter stated that she is familiar with [facility] and would like to choose that facility as a backup.  CSW Intern will follow up with daughter today with an update on the facility search process.  Ms. Fabiola Backer thanks [place] Intern for social work's involvement with her mothers discharge planning.    Employment status:   Retired Nurse, adult PT Recommendations:  [place] / Referral to community resources:  [place]  Patient/Family's Response to care:  Patient's daughter express any concerns regarding care during hospital stay.  Patient/Family's Understanding of and Emotional Response to Diagnosis, Current Treatment, and Prognosis:  Not discussed  Emotional Assessment Appearance:  Appears stated age Attitude/Demeanor/Rapport:   (Appropriate) Affect (typically observed):  Appropriate Orientation:  Oriented to Self, Oriented to Place Alcohol / Substance use:  Not Applicable Psych involvement (Current and /or in the community):  No (Comment)  Discharge Needs  Concerns to be addressed:  Discharge Planning Concerns Readmission within the last 30 days:  No Current discharge risk:  None Barriers to Discharge:  No Barriers Identified   Linward Headland, [place] Work 12/29/2016, 1:29 PM

## 2016-12-30 DIAGNOSIS — S069X0A Unspecified intracranial injury without loss of consciousness, initial encounter: Secondary | ICD-10-CM

## 2016-12-30 LAB — RENAL FUNCTION PANEL
ANION GAP: 9 (ref 5–15)
Albumin: 2.6 g/dL — ABNORMAL LOW (ref 3.5–5.0)
BUN: 8 mg/dL (ref 6–20)
CALCIUM: 8.6 mg/dL — AB (ref 8.9–10.3)
CO2: 25 mmol/L (ref 22–32)
Chloride: 103 mmol/L (ref 101–111)
Creatinine, Ser: 0.49 mg/dL (ref 0.44–1.00)
GFR calc Af Amer: >60 mL/min (ref >60)
GFR calc non Af Amer: >60 mL/min (ref >60)
GLUCOSE: 91 mg/dL (ref 65–99)
Phosphorus: 2.6 mg/dL (ref 2.5–4.6)
Potassium: 3.2 mmol/L — ABNORMAL LOW (ref 3.5–5.1)
SODIUM: 137 mmol/L (ref 135–145)

## 2016-12-30 LAB — CBC
HCT: 25.4 % — ABNORMAL LOW (ref 36.0–46.0)
HEMOGLOBIN: 8.1 g/dL — AB (ref 12.0–15.0)
MCH: 26.6 pg (ref 26.0–34.0)
MCHC: 31.9 g/dL (ref 30.0–36.0)
MCV: 83.3 fL (ref 78.0–100.0)
Platelets: 321 10*3/uL (ref 150–400)
RBC: 3.05 MIL/uL — ABNORMAL LOW (ref 3.87–5.11)
RDW: 13.4 % (ref 11.5–15.5)
WBC: 6.5 10*3/uL (ref 4.0–10.5)

## 2016-12-30 LAB — GLUCOSE, CAPILLARY
GLUCOSE-CAPILLARY: 87 mg/dL (ref 65–99)
Glucose-Capillary: 85 mg/dL (ref 65–99)
Glucose-Capillary: 79 mg/dL (ref 65–99)
Glucose-Capillary: 110 mg/dL — ABNORMAL HIGH (ref 65–99)
Glucose-Capillary: 108 mg/dL — ABNORMAL HIGH (ref 65–99)

## 2016-12-30 LAB — VITAMIN B12: Vitamin B-12: 1696 pg/mL — ABNORMAL HIGH (ref 180–914)

## 2016-12-30 LAB — IRON AND TIBC
Iron: 15 ug/dL — ABNORMAL LOW (ref 28–170)
Saturation Ratios: 7 % — ABNORMAL LOW (ref 10.4–31.8)
TIBC: 200 ug/dL — ABNORMAL LOW (ref 250–450)
UIBC: 185 ug/dL

## 2016-12-30 LAB — RETICULOCYTES
RBC.: 3.04 MIL/uL — AB (ref 3.87–5.11)
RETIC COUNT ABSOLUTE: 54.7 10*3/uL (ref 19.0–186.0)
Retic Ct Pct: 1.8 % (ref 0.4–3.1)

## 2016-12-30 LAB — FERRITIN: Ferritin: 378 ng/mL — ABNORMAL HIGH (ref 11–307)

## 2016-12-30 LAB — FOLATE: FOLATE: 8 ng/mL (ref >5.9)

## 2016-12-30 LAB — MAGNESIUM: Magnesium: 1.5 mg/dL — ABNORMAL LOW (ref 1.7–2.4)

## 2016-12-30 MED ORDER — MAGNESIUM SULFATE 2 GM/50ML IV SOLN
2.0000 g | Freq: Once | INTRAVENOUS | Status: AC
  Administered 2016-12-30: 2 g via INTRAVENOUS
  Filled 2016-12-30: qty 50

## 2016-12-30 MED ORDER — POTASSIUM CHLORIDE CRYS ER 20 MEQ PO TBCR
40.0000 meq | EXTENDED_RELEASE_TABLET | Freq: Once | ORAL | Status: AC
  Administered 2016-12-30: 40 meq via ORAL
  Filled 2016-12-30: qty 2

## 2016-12-30 NOTE — Progress Notes (Signed)
Rehab admissions - I met with patient and her sons at the bedside.  Patient is confused and disoriented at this time.  She was independent PTA per family.  I gave sons rehab booklets.  Sons would like to discuss inpatient rehab versus SNF at [facility] with family and then make a decision.  I have asked the sons to call me later today if possible.  Call me for questions.  #[phone]

## 2016-12-30 NOTE — Consult Note (Signed)
Physical Medicine and Rehabilitation Consult   Reason for Consult: SDH Referring Physician: Dr. Cyndy Freeze   HPI: Brittany Christian is a 83 y.o. female with history of HTN, B 12 deficitency, dementia, breast cancer who was admitted on 12/23/16 with mental status changes, difficulty walking and poor po intake. Family reported finding her on the floor incontinent of stool about 10 days earlier and patient with progressive decline. CT head done revealing subacute to chronic 19 mm left SDH with 13 mm midline shift to the right. She was taken to OR for 3/29 for left crani for evacuation of SDH by Dr. Cyndy Freeze.  Post op with ABLA as well as hypomagnesemia. Lethargy resolving and she was started on dysphagia 1, thin liquids on 4/2 with max cues due to refusal of oral intake.  Therapy evaluations done showing poor attention requiring redirection, visual impairments, confusion,  kyphotic posture affecting ability to carry out ADL tasks and mobility. CIR recommended for follow up therapy.    Patient states he lives with her husband. She states he is in good health. Patient thinks she is in [place] and that she just had a birthday and her kids were visiting Patient asking whether I have candy  Review of Systems  HENT: Negative for ear pain, hearing loss and tinnitus.   Eyes: Negative for blurred vision.  Respiratory: Negative for cough and shortness of breath.   Cardiovascular: Negative for chest pain and palpitations.  Gastrointestinal: Negative for abdominal pain, heartburn and nausea.  Genitourinary: Negative for dysuria and urgency.  Musculoskeletal: Negative for back pain, joint pain and neck pain.  Neurological: Positive for headaches. Negative for sensory change, speech change and focal weakness.  Psychiatric/Behavioral: Positive for memory loss. The patient does not have insomnia.       Past Medical History:  Diagnosis Date  . Cancer ([place])   . Hypertension     Past Surgical History:    Procedure Laterality Date  . ABDOMINAL HYSTERECTOMY    . CRANIOTOMY Left 12/24/2016   Procedure: CRANIOTOMY HEMATOMA EVACUATION SUBDURAL;  Surgeon: Kevan Ny Ditty, MD;  Location: [facility];  Service: Neurosurgery;  Laterality: Left;  Marland Kitchen MASTECTOMY      Family History  Problem Relation Age of Onset  . Hypertension Other     Social History:  Leanne Chang with husband?  Has been staying with son since fall. She reports that she has never smoked. She has never used smokeless tobacco. She reports that she does not drink alcohol or use drugs.    Allergies: No Known Allergies    Medications Prior to Admission  Medication Sig Dispense Refill  . Ascorbic Acid (VITAMIN C PO) Take 1 tablet by mouth daily.    . verapamil (CALAN-SR) 240 MG CR tablet Take 240 mg by mouth daily.      Home: Home Living Family/patient expects to be discharged to:: Private residence Living Arrangements: Spouse/significant other Available Help at Discharge: Family, Available 24 hours/day (Children live nearby) Type of Home: House Additional Comments: poor historian and repeating statements that therapist stated  Functional History: Prior Function Level of Independence: Independent Comments: Indep with all functional mobility; stopped driving in November. Cared for husband at home; cooks, cleans, and performs all ADLs.  Functional Status:  Mobility: Bed Mobility Overal bed mobility: Needs Assistance Bed Mobility: Supine to Sit Supine to sit: Max assist, +2 for physical assistance General bed mobility comments: Patient in chair Transfers Overall transfer level: Needs assistance Equipment used: 2 person hand  held assist (Refused to use RW) Transfers: Sit to/from Stand Sit to Stand: Max assist, +2 physical assistance Squat pivot transfers: Total assist, +2 physical assistance, +2 safety/equipment General transfer comment: Much encouragement to attempt to stand.  Required +2 max assist, but patient unable  to reach fully upright position with 2 attempts. Ambulation/Gait Ambulation/Gait assistance: Max assist, +2 physical assistance Ambulation Distance (Feet): 3 Feet Assistive device: 2 person hand held assist Gait Pattern/deviations: Step-to pattern General Gait Details: Unable    ADL: ADL Overall ADL's : Needs assistance/impaired Eating/Feeding Details (indicate cue type and reason): see SLP note Grooming: Wash/dry face, Maximal assistance Grooming Details (indicate cue type and reason): hand over hand Upper Body Bathing: Maximal assistance Lower Body Bathing: Total assistance Lower Body Dressing: Total assistance Lower Body Dressing Details (indicate cue type and reason): pt startles with don of socks Toilet Transfer: Maximal assistance, +2 for safety/equipment, +2 for physical assistance, Stand-pivot General ADL Comments: pt needs (A) to initiate task and to maintain attention to task. Quiet environment and one person talking helps with patients attention   Cognition: Cognition Overall Cognitive Status: Impaired/Different from baseline Orientation Level: Oriented to person, Oriented to place, Oriented to time, Oriented to situation Cognition Arousal/Alertness: Awake/alert Behavior During Therapy: WFL for tasks assessed/performed, Restless Overall Cognitive Status: Impaired/Different from baseline Area of Impairment: Following commands, Attention, Problem solving, Safety/judgement, Awareness, Memory Current Attention Level: Focused Memory: Decreased short-term memory Following Commands: Follows one step commands inconsistently Safety/Judgement: Decreased awareness of deficits, Decreased awareness of safety Awareness: Intellectual Problem Solving: Slow processing, Requires verbal cues General Comments: Patient sitting in chair - sliding to bottom of chair on her side.  Not aware of safety issue of falling off of chair.  Patient restless, fidgeting with lines.  Required max assist  to participate with PT, with encouragement of daughter and brother.   Difficult to assess due to: Level of arousal   Blood pressure (!) 164/43, pulse 64, temperature 98.9 F (37.2 C), temperature source Axillary, resp. rate 16, height 5\' 8"  (1.727 m), weight 56.6 kg (124 lb 12.5 oz), SpO2 100 %. Physical Exam  Nursing note and vitals reviewed. Constitutional: She appears well-developed and well-nourished.  HENT:  Head: Normocephalic and atraumatic.  Mouth/Throat: Oropharynx is clear and moist.  Left scalp with staple on curvilinear incision  Eyes: Conjunctivae and EOM are normal. Pupils are equal, round, and reactive to light.  Neck: Normal range of motion. Neck supple.  Cardiovascular: Normal rate and regular rhythm.   Respiratory: Effort normal and breath sounds normal. No stridor. No respiratory distress. She has no wheezes.  GI: Soft. Bowel sounds are normal. She exhibits no distension. There is no tenderness.  Musculoskeletal: She exhibits no edema or tenderness.  Neurological: She is alert.  Oriented to  Self and place only--unable to recall city, age ( 83 years old) or situation.  Easily distracted needing redirection to follow simple motor commands. Moves all four. Lacks insight and has no awareness of deficits/surgery.   Skin: Skin is warm and dry.  Motor strength is 4/5, bilateral deltoid, bicep, tricep grip. She does not really participate with lower extremity testing. She turns on her side. She will move her legs and her feet. Sensation cannot be assessed secondary to her cognition. She does not focus long enough  Results for orders placed or performed during the hospital encounter of 12/23/16 (from the past 24 hour(s))  Glucose, capillary     Status: None   Collection Time: 12/29/16 11:49 AM  Result Value Ref Range   Glucose-Capillary 82 65 - 99 mg/dL  Glucose, capillary     Status: None   Collection Time: 12/29/16  4:17 PM  Result Value Ref Range   Glucose-Capillary 91  65 - 99 mg/dL   Comment 1 Notify RN    Comment 2 Document in Chart   Glucose, capillary     Status: None   Collection Time: 12/29/16  8:42 PM  Result Value Ref Range   Glucose-Capillary 88 65 - 99 mg/dL   Comment 1 Notify RN    Comment 2 Document in Chart   Glucose, capillary     Status: None   Collection Time: 12/30/16  1:00 AM  Result Value Ref Range   Glucose-Capillary 85 65 - 99 mg/dL   Comment 1 Notify RN    Comment 2 Document in Chart   Renal function panel     Status: Abnormal   Collection Time: 12/30/16  4:11 AM  Result Value Ref Range   Sodium 137 135 - 145 mmol/L   Potassium 3.2 (L) 3.5 - 5.1 mmol/L   Chloride 103 101 - 111 mmol/L   CO2 25 22 - 32 mmol/L   Glucose, Bld 91 65 - 99 mg/dL   BUN 8 6 - 20 mg/dL   Creatinine, Ser 0.49 0.44 - 1.00 mg/dL   Calcium 8.6 (L) 8.9 - 10.3 mg/dL   Phosphorus 2.6 2.5 - 4.6 mg/dL   Albumin 2.6 (L) 3.5 - 5.0 g/dL   GFR calc non Af Amer >60 >60 mL/min   GFR calc Af Amer >60 >60 mL/min   Anion gap 9 5 - 15  Magnesium     Status: Abnormal   Collection Time: 12/30/16  4:11 AM  Result Value Ref Range   Magnesium 1.5 (L) 1.7 - 2.4 mg/dL  Glucose, capillary     Status: None   Collection Time: 12/30/16  4:45 AM  Result Value Ref Range   Glucose-Capillary 79 65 - 99 mg/dL   Comment 1 Notify RN    Comment 2 Document in Chart    No results found.  Assessment/Plan: Diagnosis: Traumatic brain injury, subdural hematoma 1. Does the need for close, 24 hr/day medical supervision in concert with the patient's rehab needs make it unreasonable for this patient to be served in a less intensive setting? Potentially 2. Co-Morbidities requiring supervision/potential complications: Hypertension, B12 deficiency, history of breast carcinoma, history of dementia, question severity 3. Due to bladder management, bowel management, safety, skin/wound care, disease management, medication administration, pain management and patient education, does the patient  require 24 hr/day rehab nursing? Yes 4. Does the patient require coordinated care of a physician, rehab nurse, PT (1-2 hrs/day, 5 days/week), OT (1-2 hrs/day, 5 days/week) and SLP (.5-1 hrs/day, 5 days/week) to address physical and functional deficits in the context of the above medical diagnosis(es)? Yes Addressing deficits in the following areas: balance, endurance, locomotion, strength, transferring, bowel/bladder control, bathing, dressing, feeding, grooming, toileting, cognition and psychosocial support 5. Can the patient actively participate in an intensive therapy program of at least 3 hrs of therapy per day at least 5 days per week? Yes 6. The potential for patient to make measurable gains while on inpatient rehab is good if her baseline dementia is mild 7. Anticipated functional outcomes upon discharge from inpatient rehab are supervision  with PT, supervision with OT, supervision with SLP. 8. Estimated rehab length of stay to reach the above functional goals is: 14-17d 9. Does the patient  have adequate social supports and living environment to accommodate these discharge functional goals? Yes 10. Anticipated D/C setting: Home 11. Anticipated post D/C treatments: [place] therapy 12. Overall Rehab/Functional Prognosis: dependant upon her cognitive baseline  RECOMMENDATIONS: This patient's condition is appropriate for continued rehabilitative care in the following setting: CIR vs SNF, if pt was independant PTA then CIR Patient has agreed to participate in recommended program. N/A Note that insurance prior authorization may be required for reimbursement for recommended care.  Comment: If patient has severe baseline dementia, then would recommend SNF, we'll need to get family member to discuss baseline cognition   Bary Leriche, [place] 12/30/2016

## 2016-12-30 NOTE — Care Management Obs Status (Deleted)
[facility] NOTIFICATION   Patient Details  Name: Brittany Christian MRN: [id number] Date of Birth: 01/12/1931   Medicare Observation Status Notification Given:  Yes    Sharin Mons, RN 12/30/2016, 11:57 AM

## 2016-12-30 NOTE — Progress Notes (Signed)
Occupational Therapy Treatment Patient Details Name: Brittany Christian MRN: [id number] DOB: [birth year] Today's Date: 12/30/2016    History of present illness Pt is an 83 y.o. female admitted on 12/23/16 after a reported 10-day history of altered mental status since a fall on 3/18. CT showed L SDH with midline shift. Pt now s/p L frontotemporal crani 3/29. Pertinent PMH includes HTN, dementia, cancer.    OT comments  Pt making progress towards OT goals. Please see performance level below. Session focused on toilet transfer and grooming. PT continues to benefit from skilled OT in the acute setting and requires CIR level therapy upon dc to maximize safety and independence in ADL. Next session to look at vision if command following has improved.   Follow Up Recommendations  CIR    Equipment Recommendations  Other (comment) (defer to next venue)    Recommendations for Other Services      Precautions / Restrictions Precautions Precautions: Fall Restrictions Weight Bearing Restrictions: No       Mobility Bed Mobility Overal bed mobility: Needs Assistance Bed Mobility: Supine to Sit     Supine to sit: Min assist;HOB elevated     General bed mobility comments: use of bed rail  Transfers Overall transfer level: Needs assistance Equipment used: 2 person hand held assist Transfers: Stand Pivot Transfers;Sit to/from Stand Sit to Stand: Max assist;+2 physical assistance;+2 safety/equipment Stand pivot transfers: Max assist;+2 physical assistance;+2 safety/equipment       General transfer comment: Pt does better when given a "count 1..2.Marland KitchenMarland Kitchen3"    Balance Overall balance assessment: Needs assistance Sitting-balance support: No upper extremity supported;Feet supported Sitting balance-Leahy Scale: Fair Sitting balance - Comments: sitting EOB with no back support   Standing balance support: Bilateral upper extremity supported Standing balance-Leahy Scale: Zero Standing balance comment:  vc for upright posture, Pt unable to come to full upright                           ADL either performed or assessed with clinical judgement   ADL Overall ADL's : Needs assistance/impaired     Grooming: Wash/dry face;Maximal assistance Grooming Details (indicate cue type and reason): hand over hand                 Toilet Transfer: Maximal assistance;+2 for physical assistance;Stand-pivot;+2 for safety/equipment;BSC Toilet Transfer Details (indicate cue type and reason): transfer x2 max assist with verbal cues and heavy use of gait belt to assist Toileting- Clothing Manipulation and Hygiene:  (pt with catheter and vc to not pull on it.)       Functional mobility during ADLs: Maximal assistance;+2 for physical assistance;+2 for safety/equipment (HHA) General ADL Comments: pt needs (A) to initiate task and to maintain attention to task. Quiet environment and one person talking helps with patients attention      Vision   Vision Assessment?:  (not able to assist this session)   Perception     Praxis      Cognition Arousal/Alertness: Awake/alert Behavior During Therapy: [facility] for tasks assessed/performed;Restless Overall Cognitive Status: Impaired/Different from baseline Area of Impairment: Following commands;Attention;Problem solving;Safety/judgement;Awareness;Memory                   Current Attention Level: Focused Memory: Decreased short-term memory Following Commands: Follows one step commands inconsistently Safety/Judgement: Decreased awareness of deficits;Decreased awareness of safety Awareness: Intellectual Problem Solving: Slow processing;Requires verbal cues General Comments: Pt continues demonstrating confusion and max cues. Pt does better  when room is quiet and only one person is providing instructions in calm smooth voice        Exercises     Shoulder Instructions       General Comments      Pertinent Vitals/ Pain       Pain  Assessment: No/denies pain  Home Living                                          Prior Functioning/Environment              Frequency  Min 2X/week        Progress Toward Goals  OT Goals(current goals can now be found in the care plan section)  Progress towards OT goals: Progressing toward goals  Acute Rehab OT Goals Patient Stated Goal: get home to her grandbabies OT Goal Formulation: With patient Time For Goal Achievement: 01/11/17 Potential to Achieve Goals: Good  Plan Discharge plan remains appropriate    Co-evaluation                 End of Session Equipment Utilized During Treatment: Gait belt  OT Visit Diagnosis: Unsteadiness on feet (R26.81);Other symptoms and signs involving cognitive function   Activity Tolerance Patient tolerated treatment well   Patient Left in chair;with call bell/phone within reach;with chair alarm set   Nurse Communication Mobility status;Precautions        Time: 2863-8177 OT Time Calculation (min): 26 min  Charges: OT General Charges $OT Visit: 1 Procedure OT Treatments $Self Care/Home Management : 8-22 mins $Therapeutic Activity: 8-22 mins  Hulda Humphrey OTR/L [place] 12/30/2016, 2:02 PM

## 2016-12-30 NOTE — Progress Notes (Signed)
Family Medicine Teaching Service Daily Progress Note Intern Pager: [phone]  Patient name: Brittany Christian Medical record number: [id number] Date of birth: 08/31/1931 Age: 83 y.o. Gender: female  Primary Care Provider: Madelyn Brunner, MD Consultants: Neurosurgery (signed off) Code Status: FULL  Pt Overview and Major Events to Date:  3/28 presenting 10 days after a fall with subacute subdural hematoma with midline shift 3/29 Left frontotemporopareital craniotomy  4/2 Transferred out of ICU  Assessment and Plan: Brittany Sherman Cloughis a 83 y.o.femalepresenting with AMS x 10 days. PMH is significant for HTN.   Subdural hematoma s/p craniotomy, improving: Possible component of mild baseline dementia - Per neurosurgery, patient is neurologically stable. Following from [facility]. - Continuing Keppra IV every 12 hours for a total of 7 days, last dose on 4/4 - SLP rec Dys 1 diet and thin liquids with potential retrials once dentures are available - PT/OT recommending CIR. Order placed - norco 5-325mg  q4h prn  HTN: patient takes verapamil CR at home. BP today 160s/40-60s -verapamil 80 mg Q8H  -monitor BPs  Anemia likely 2/2 ICH. Baseline hgb appears to be ~10.5 - anemia panel - monitor CBC  Dementia Per chart review, baseline as described by son is that patient cooks, laundry, eats, doesn't manage money, walks without assistance, does crossword puzzles, does repeat conversations, no wandering, stopped driving in November. She is only oriented to self today and can only intermittently follow commands - monitor mental status  AKI, resolved: Likely prerenal d/t limited po intake at home. Baseline Creatinine 1 year ago 0.7, Cr 1.51 > 1.1 >0.9>0.7>0.57.   Hyperglycemia, resolved likely secondary to steroids, glucose now controlled  FEN/GI: Dysphagia 1 diet PPx: SCDs  Disposition:pending CIR  Subjective:  Patient able to speak and states she is comfortable. She is only oriented to  self.  Objective: Temp:  [97.8 F (36.6 C)-98.9 F (37.2 C)] 98.9 F (37.2 C) (04/04 0109) Pulse Rate:  [57-70] 64 (04/04 0556) Resp:  [16-18] 16 (04/04 0556) BP: (144-170)/(43-78) 164/43 (04/04 0556) SpO2:  [100 %] 100 % (04/04 0556) Physical Exam: General: lying in bed comfortably, in no NAD. HEENT: L side of head shaved with staples that are clean and dry without erythema. Cardiovascular: RRR, no murmurs Respiratory: CTAB, normal effort on room air Abdomen: soft, nontender, nondistended, + bowel sounds Extremities: no edema, SCDs on  Neuro: oriented to self only, only intermittently following commands, but responding well verbally  Laboratory:  Recent Labs Lab 12/27/16 0301 12/28/16 0712 12/29/16 0335  WBC 13.5* 9.4 6.2  HGB 8.0* 8.0* 7.2*  HCT 24.8* 25.2* 22.5*  PLT 208 246 275    Recent Labs Lab 12/23/16 1536  12/27/16 0301 12/29/16 0335 12/30/16 0411  NA 140  < > 142 142 137  K 5.2*  < > 3.7 3.2* 3.2*  CL 101  < > 108 108 103  CO2 26  < > 23 24 25   BUN 35*  < > 21* 14 8  CREATININE 1.51*  < > 0.70 0.57 0.49  CALCIUM 9.7  < > 9.0 8.7* 8.6*  PROT 6.7  --   --   --   --   BILITOT 1.7*  --   --   --   --   ALKPHOS 41  --   --   --   --   ALT 13*  --   --   --   --   AST 37  --   --   --   --  GLUCOSE 119*  < > 118* 81 91  < > = values in this interval not displayed.  Imaging/Diagnostic Tests: No results found.  Bufford Lope, DO 12/30/2016, 6:45 AM PGY-1, [place] Intern pager: [phone], text pages welcome

## 2016-12-31 DIAGNOSIS — R4189 Other symptoms and signs involving cognitive functions and awareness: Secondary | ICD-10-CM

## 2016-12-31 DIAGNOSIS — S0990XS Unspecified injury of head, sequela: Secondary | ICD-10-CM

## 2016-12-31 DIAGNOSIS — S0990XA Unspecified injury of head, initial encounter: Secondary | ICD-10-CM

## 2016-12-31 LAB — CBC
HCT: 26.7 % — ABNORMAL LOW (ref 36.0–46.0)
Hemoglobin: 8.7 g/dL — ABNORMAL LOW (ref 12.0–15.0)
MCH: 26.7 pg (ref 26.0–34.0)
MCHC: 32.6 g/dL (ref 30.0–36.0)
MCV: 81.9 fL (ref 78.0–100.0)
PLATELETS: 363 10*3/uL (ref 150–400)
RBC: 3.26 MIL/uL — ABNORMAL LOW (ref 3.87–5.11)
RDW: 13.4 % (ref 11.5–15.5)
WBC: 6.2 10*3/uL (ref 4.0–10.5)

## 2016-12-31 LAB — BASIC METABOLIC PANEL
Anion gap: 9 (ref 5–15)
BUN: 5 mg/dL — ABNORMAL LOW (ref 6–20)
CALCIUM: 8.7 mg/dL — AB (ref 8.9–10.3)
CO2: 26 mmol/L (ref 22–32)
CREATININE: 0.5 mg/dL (ref 0.44–1.00)
Chloride: 101 mmol/L (ref 101–111)
Glucose, Bld: 97 mg/dL (ref 65–99)
Potassium: 3.1 mmol/L — ABNORMAL LOW (ref 3.5–5.1)
Sodium: 136 mmol/L (ref 135–145)

## 2016-12-31 LAB — MAGNESIUM: MAGNESIUM: 1.8 mg/dL (ref 1.7–2.4)

## 2016-12-31 MED ORDER — PANTOPRAZOLE SODIUM 40 MG PO TBEC
40.0000 mg | DELAYED_RELEASE_TABLET | Freq: Every day | ORAL | Status: DC
  Administered 2016-12-31: 40 mg via ORAL
  Filled 2016-12-31: qty 1

## 2016-12-31 MED ORDER — POTASSIUM CHLORIDE CRYS ER 20 MEQ PO TBCR
40.0000 meq | EXTENDED_RELEASE_TABLET | Freq: Once | ORAL | Status: AC
  Administered 2016-12-31: 40 meq via ORAL
  Filled 2016-12-31: qty 2

## 2016-12-31 NOTE — Progress Notes (Signed)
Rehab admissions - I spoke with family at the bedside.  They want to pursue [facility] in [place] rather than inpatient rehab here at [facility].  Call me for questions.  #[phone]

## 2016-12-31 NOTE — Progress Notes (Signed)
Family Medicine Teaching Service Daily Progress Note Intern Pager: [phone]  Patient name: Brittany Christian Medical record number: [id number] Date of birth: [birth year] Age: 83 y.o. Gender: female  Primary Care Provider: Madelyn Brunner, MD Consultants: Neurosurgery (signed off) Code Status: FULL  Pt Overview and Major Events to Date:  3/28 presenting 10 days after a fall with subacute subdural hematoma with midline shift 3/29 Left frontotemporopareital craniotomy  4/2 Transferred out of ICU  Assessment and Plan: Brittany Oriol Cloughis a 83 y.o.femalepresenting with AMS x 10 days. PMH is significant for HTN.   Subdural hematoma s/p craniotomy, improving: Possible component of mild baseline dementia. Mental status appears to be back to baseline. - Per neurosurgery, patient is neurologically stable. Following from [place]. - s/p Keppra IV total of 7 days, last dose was on 4/4 - SLP rec Dys 1 diet and thin liquids with potential retrials once dentures are available - PT/OT recommending CIR. Family now would like SNF. - norco 5-325mg  q4h prn  HTN: patient takes verapamil CR at home. BP today 150-170s/50-70s -verapamil 80 mg Q8H  -monitor BPs  Anemia likely 2/2 ICH. hgb stable today at 8.7 Baseline hgb appears to be ~10.5 - anemia panel: iron 15, TIBC 200, ferritin 378, folate 8, vit b12 1696 - monitor CBC  Dementia Per chart review, baseline as described by son is that patient cooks, laundry, eats, doesn't manage money, walks without assistance, does crossword puzzles, does repeat conversations, no wandering, stopped driving in November. She is very interactive today although she remains only oriented to self. - monitor mental status, appears to be back to her baseline function  AKI, resolved: Likely prerenal d/t limited po intake at home. Baseline Creatinine 1 year ago 0.7, Cr 1.51 > 1.1 >0.9>0.7>0.57.   Hyperglycemia, resolved likely secondary to steroids, glucose now  controlled  FEN/GI: Dysphagia 1 diet PPx: SCDs  Disposition:pending SNF placement  Subjective:  Patient is oriented only to self. She is very interactive this morning. Denies pain.  Is confused and disoriented, thinks she is visiting her mother in the hospital and is asking to go home.  Objective: Temp:  [97.3 F (36.3 C)-99.1 F (37.3 C)] 97.7 F (36.5 C) (04/05 0523) Pulse Rate:  [73-88] 73 (04/05 0523) Resp:  [16-19] 18 (04/05 0523) BP: (142-177)/(52-89) 174/68 (04/05 0523) SpO2:  [94 %-100 %] 100 % (04/05 0523) Physical Exam: General: Sitting comfortably in bed, in no NAD. HEENT: L side of head shaved with staples that are clean and dry without erythema. Cardiovascular: RRR, no murmurs Respiratory: CTAB, normal effort on room air Abdomen: soft, nontender, nondistended, + bowel sounds Extremities: no edema, SCDs on  Neuro: oriented to self only, conversing well.   Laboratory:  Recent Labs Lab 12/29/16 0335 12/30/16 0911 12/31/16 0408  WBC 6.2 6.5 6.2  HGB 7.2* 8.1* 8.7*  HCT 22.5* 25.4* 26.7*  PLT 275 321 363    Recent Labs Lab 12/29/16 0335 12/30/16 0411 12/31/16 0408  NA 142 137 136  K 3.2* 3.2* 3.1*  CL 108 103 101  CO2 24 25 26   BUN 14 8 5*  CREATININE 0.57 0.49 0.50  CALCIUM 8.7* 8.6* 8.7*  GLUCOSE 81 91 97    Imaging/Diagnostic Tests: No results found.  Bufford Lope, DO 12/31/2016, 8:06 AM PGY-1, [place] Intern pager: [phone], text pages welcome

## 2016-12-31 NOTE — Progress Notes (Signed)
qPhysical Therapy Treatment Patient Details Name: Brittany Christian MRN: [id number] DOB: 11-15-1930 Today's Date: 12/31/2016    History of Present Illness Pt is an 83 y.o. female admitted on 12/23/16 after a reported 10-day history of altered mental status since a fall on 3/18. CT showed L SDH with midline shift. Pt now s/p L frontotemporal crani 3/29. Pertinent PMH includes HTN, dementia, cancer.     PT Comments    Pt remains confused and easily distractible, but showed improvements in functional mobility and ambulation since last session. She requires +2 min hand held assist for ambulation and is Mod I for bed mobility.  The session was affected by her impaired cognition, which may contribute to limited carryover.  Family has decided to pursue SNF instead of CIR.   She will benefit from continued PT services to return to PLOF at the venue listed below.  Will follow acutely.   Follow Up Recommendations  SNF;Supervision/Assistance - 24 hour     Equipment Recommendations       Recommendations for Other Services       Precautions / Restrictions Precautions Precautions: Fall Restrictions Weight Bearing Restrictions: No    Mobility  Bed Mobility Overal bed mobility: Modified Independent Bed Mobility: Supine to Sit     Supine to sit: Modified independent (Device/Increase time)     General bed mobility comments: Excessive cueing and extra time needed to get pt to EOB because of confusion, but no physical assistance needed.  Transfers Overall transfer level: Needs assistance Equipment used: 2 person hand held assist Transfers: Sit to/from Stand Sit to Stand: Min assist         General transfer comment: VC's for hand placement and power up.  Once standing pt grabbed therapist arm with both hands until adjusted for bilateral hand held support + gait belt support.   Ambulation/Gait Ambulation/Gait assistance: +2 physical assistance Ambulation Distance (Feet): 50 Feet Assistive  device: 2 person hand held assist Gait Pattern/deviations: Decreased stride length;Narrow base of support Gait velocity: decreased Gait velocity interpretation: Below normal speed for age/gender General Gait Details: Pt steadier as gait progressed.  Motivated by son walking in front of [place] and encouraging her.  Very distracted by other people and conversations in hallway.   Stairs            Wheelchair Mobility    Modified Rankin (Stroke Patients Only)       Balance Overall balance assessment: Needs assistance Sitting-balance support: Bilateral upper extremity supported;Feet supported Sitting balance-Leahy Scale: Fair Sitting balance - Comments: sitting EOB with no back support. Also sat with one foot on floor for extended time before agreeing to move to edge to stand.   Standing balance support: Bilateral upper extremity supported Standing balance-Leahy Scale: Poor                              Cognition Arousal/Alertness: Awake/alert Behavior During Therapy: WFL for tasks assessed/performed;Restless Overall Cognitive Status: Impaired/Different from baseline Area of Impairment: Attention;Memory;Following commands;Safety/judgement;Awareness                   Current Attention Level: Alternating Memory: Decreased short-term memory Following Commands: Follows one step commands inconsistently Safety/Judgement: Decreased awareness of safety;Decreased awareness of deficits   Problem Solving: Slow processing;Requires verbal cues;Decreased initiation General Comments: Pt very quick to respond with smarty comments.  Easily distracted and asks same questions repeatedly.      Exercises  General Comments        Pertinent Vitals/Pain Pain Assessment: No/denies pain    Home Living                      Prior Function            PT Goals (current goals can now be found in the care plan section) Acute Rehab PT Goals Patient Stated Goal:  to get home to her husband PT Goal Formulation: With patient Time For Goal Achievement: 01/14/17 Progress towards PT goals: Progressing toward goals    Frequency    Min 3X/week      PT Plan Current plan remains appropriate    Co-evaluation             End of Session Equipment Utilized During Treatment: Gait belt Activity Tolerance: Patient tolerated treatment well Patient left: in chair;with call bell/phone within reach;with chair alarm set;with family/visitor present   PT Visit Diagnosis: Unsteadiness on feet (R26.81);Other abnormalities of gait and mobility (R26.89);History of falling (Z91.81)     Time: 7035-0093 PT Time Calculation (min) (ACUTE ONLY): 23 min  Charges:                       G Codes:       Gaetano Net SPT   Gaetano Net 12/31/2016, 1:02 PM

## 2016-12-31 NOTE — Care Management Note (Addendum)
Case Management Note  Patient Details  Name: Brittany Christian MRN: [id number] Date of Birth: Jan 09, 1931  Subjective/Objective:                 11:00 Patient being considered for CIR vs SNF at DC. CM updated by CIR admission RN Genie that family considering SNF. CM verified CSW consult in place, and notifed Dominica CSW of possible SNF DC.  15:30 Spoke with FM res., MD states team waiting for verification of SNF placement, CM requestewd Lorriane Shire CSW to call [phone].   Action/Plan:   Expected Discharge Date:                  Expected Discharge Plan:  Skilled Nursing Facility  In-House Referral:  Clinical Social Work  Discharge planning Services  CM Consult  Post Acute Care Choice:    Choice offered to:     DME Arranged:    DME Agency:     HH Arranged:    [place] Agency:     Status of Service:  In process, will continue to follow  If discussed at Long Length of Stay Meetings, dates discussed:    Additional Comments:  Carles Collet, RN 12/31/2016, 1:24 PM

## 2016-12-31 NOTE — Progress Notes (Signed)
Pt foley removed as ordered. Delia Heady RN

## 2016-12-31 NOTE — Clinical Social Work Note (Signed)
Clinicals transmitted to [facility] [phone]) and [place] and Delegation Oversight Manager with [facility] [phone], ext [phone]) contacted and message left regarding patient's readiness for discharge Friday or the weekend.  Salena Ortlieb Givens, MSW, LCSW Licensed Clinical Social Worker [place] [phone]

## 2017-01-01 DIAGNOSIS — R531 Weakness: Secondary | ICD-10-CM | POA: Diagnosis not present

## 2017-01-01 DIAGNOSIS — M6281 Muscle weakness (generalized): Secondary | ICD-10-CM | POA: Diagnosis not present

## 2017-01-01 DIAGNOSIS — R4189 Other symptoms and signs involving cognitive functions and awareness: Secondary | ICD-10-CM | POA: Diagnosis not present

## 2017-01-01 DIAGNOSIS — I629 Nontraumatic intracranial hemorrhage, unspecified: Secondary | ICD-10-CM | POA: Diagnosis not present

## 2017-01-01 DIAGNOSIS — S0990XS Unspecified injury of head, sequela: Secondary | ICD-10-CM | POA: Diagnosis not present

## 2017-01-01 DIAGNOSIS — S065X9D Traumatic subdural hemorrhage with loss of consciousness of unspecified duration, subsequent encounter: Secondary | ICD-10-CM | POA: Diagnosis not present

## 2017-01-01 DIAGNOSIS — I6200 Nontraumatic subdural hemorrhage, unspecified: Secondary | ICD-10-CM | POA: Diagnosis not present

## 2017-01-01 DIAGNOSIS — I10 Essential (primary) hypertension: Secondary | ICD-10-CM | POA: Diagnosis not present

## 2017-01-01 DIAGNOSIS — K5900 Constipation, unspecified: Secondary | ICD-10-CM | POA: Diagnosis not present

## 2017-01-01 DIAGNOSIS — R262 Difficulty in walking, not elsewhere classified: Secondary | ICD-10-CM | POA: Diagnosis not present

## 2017-01-01 DIAGNOSIS — R41841 Cognitive communication deficit: Secondary | ICD-10-CM | POA: Diagnosis not present

## 2017-01-01 DIAGNOSIS — D649 Anemia, unspecified: Secondary | ICD-10-CM | POA: Diagnosis not present

## 2017-01-01 DIAGNOSIS — R4182 Altered mental status, unspecified: Secondary | ICD-10-CM | POA: Diagnosis not present

## 2017-01-01 DIAGNOSIS — R1312 Dysphagia, oropharyngeal phase: Secondary | ICD-10-CM | POA: Diagnosis not present

## 2017-01-01 DIAGNOSIS — F0390 Unspecified dementia without behavioral disturbance: Secondary | ICD-10-CM | POA: Diagnosis not present

## 2017-01-01 LAB — CBC
HCT: 27.7 % — ABNORMAL LOW (ref 36.0–46.0)
Hemoglobin: 8.9 g/dL — ABNORMAL LOW (ref 12.0–15.0)
MCH: 26.5 pg (ref 26.0–34.0)
MCHC: 32.1 g/dL (ref 30.0–36.0)
MCV: 82.4 fL (ref 78.0–100.0)
PLATELETS: 400 10*3/uL (ref 150–400)
RBC: 3.36 MIL/uL — ABNORMAL LOW (ref 3.87–5.11)
RDW: 13.6 % (ref 11.5–15.5)
WBC: 6.4 10*3/uL (ref 4.0–10.5)

## 2017-01-01 LAB — BASIC METABOLIC PANEL
Anion gap: 10 (ref 5–15)
CHLORIDE: 99 mmol/L — AB (ref 101–111)
CO2: 27 mmol/L (ref 22–32)
CREATININE: 0.45 mg/dL (ref 0.44–1.00)
Calcium: 9.1 mg/dL (ref 8.9–10.3)
GFR calc Af Amer: >60 mL/min (ref >60)
GLUCOSE: 112 mg/dL — AB (ref 65–99)
POTASSIUM: 3.4 mmol/L — AB (ref 3.5–5.1)
SODIUM: 136 mmol/L (ref 135–145)

## 2017-01-01 MED ORDER — POLYETHYLENE GLYCOL 3350 17 G PO PACK: 17.0000 g | PACK | Freq: Every day | ORAL | 0 refills | Status: DC | PRN

## 2017-01-01 MED ORDER — POTASSIUM CHLORIDE CRYS ER 20 MEQ PO TBCR
40.0000 meq | EXTENDED_RELEASE_TABLET | Freq: Once | ORAL | Status: AC
  Administered 2017-01-01: 40 meq via ORAL
  Filled 2017-01-01: qty 2

## 2017-01-01 MED ORDER — VERAPAMIL HCL ER 240 MG PO TBCR
240.0000 mg | EXTENDED_RELEASE_TABLET | Freq: Every day | ORAL | Status: DC
  Administered 2017-01-01: 240 mg via ORAL
  Filled 2017-01-01: qty 1

## 2017-01-01 MED ORDER — ENSURE ENLIVE PO LIQD: 237.0000 mL | Freq: Two times a day (BID) | ORAL | 12 refills | Status: DC

## 2017-01-01 NOTE — Progress Notes (Signed)
Family Medicine Teaching Service Daily Progress Note Intern Pager: [phone]  Patient name: Brittany Christian Medical record number: [id number] Date of birth: [birth year] Age: 83 y.o. Gender: female  Primary Care Provider: Madelyn Brunner, MD Consultants: Neurosurgery (signed off) Code Status: FULL  Pt Overview and Major Events to Date:  3/28 presenting 10 days after a fall with subacute subdural hematoma with midline shift 3/29 Left frontotemporopareital craniotomy  4/2 Transferred out of ICU  Assessment and Plan: Brittany Mapel Cloughis a 83 y.o.femalepresenting with AMS x 10 days. PMH is significant for HTN.   Subdural hematoma s/p craniotomy, improving: Possible component of mild baseline dementia. Mental status appears to be back to baseline. - Per neurosurgery, patient is neurologically stable. Following from [place]. - s/p Keppra IV total of 7 days, last dose was on 4/4 - SLP rec Dys 1 diet and thin liquids with potential retrials once dentures are available - PT/OT recommending CIR. Family now would like SNF. Awaiting SNF placement - norco 5-325mg  q4h prn  HTN: patient takes verapamil CR 240mg  qd at home. BP today 166/78 -resume home verapamil today in preparation for transfer to SNF when bed available -monitor BPs  Anemia likely 2/2 ICH. hgb stable at 8.9 Baseline hgb appears to be ~10.5 - anemia panel: iron 15, TIBC 200, ferritin 378, folate 8, vit b12 1696  Dementia Per chart review, baseline as described by son is that patient cooks, laundry, eats, doesn't manage money, walks without assistance, does crossword puzzles, does repeat conversations, no wandering, stopped driving in November. She is very interactive today although she remains only oriented to self. - monitor mental status, appears to be back to her baseline function  AKI, resolved: Likely prerenal d/t limited po intake at home. Baseline Creatinine 1 year ago 0.7, Cr 1.51 > 1.1 >0.9>0.7>0.57.   Hyperglycemia,  resolved likely secondary to steroids, glucose now controlled  FEN/GI: Dysphagia 1 diet PPx: SCDs  Disposition:pending SNF placement, patient is medically ready  Subjective:  Patient is oriented only to self and is confused this morning. Voices she does not know why she is in the hospital.   Objective: Temp:  [97.4 F (36.3 C)-98.7 F (37.1 C)] 98.7 F (37.1 C) (04/06 0859) Pulse Rate:  [70-108] 73 (04/06 0859) Resp:  [19-20] 20 (04/06 0859) BP: (159-175)/(76-96) 166/78 (04/06 0859) SpO2:  [98 %-100 %] 98 % (04/06 0859) Physical Exam: General: Lying comfortably in bed, in no NAD. HEENT: L side of head shaved with staples that are clean and dry without erythema. Cardiovascular: RRR, no murmurs Respiratory: CTAB, normal effort on room air Abdomen: soft, nontender, nondistended, + bowel sounds Extremities: no edema Neuro: oriented to self only, conversing well.   Laboratory:  Recent Labs Lab 12/30/16 0911 12/31/16 0408 01/01/17 0612  WBC 6.5 6.2 6.4  HGB 8.1* 8.7* 8.9*  HCT 25.4* 26.7* 27.7*  PLT 321 363 400    Recent Labs Lab 12/30/16 0411 12/31/16 0408 01/01/17 0612  NA 137 136 136  K 3.2* 3.1* 3.4*  CL 103 101 99*  CO2 25 26 27   BUN 8 5* <5*  CREATININE 0.49 0.50 0.45  CALCIUM 8.6* 8.7* 9.1  GLUCOSE 91 97 112*    Imaging/Diagnostic Tests: No results found.  Bufford Lope, DO 01/01/2017, 9:06 AM PGY-1, [place] Intern pager: [phone], text pages welcome

## 2017-01-01 NOTE — Progress Notes (Signed)
Pt discharge education and instructions completed; pt discharge to [facility] nursing [facility] and report called off to nurse Suanne Marker at the facility. Pt IV removed; head incision staples remains clean, dry and intact with no bleeding or drainage noted. Pt awaiting on PTAR to transport off to disposition. Will closely monitor till pick up. P. Angelica Pou RN

## 2017-01-01 NOTE — Progress Notes (Signed)
Patient still awaiting transport to [facility] nursing home, all discharge items taken care of by day nurse.

## 2017-01-01 NOTE — Progress Notes (Signed)
  Speech Language Pathology Treatment: Dysphagia  Patient Details Name: Brittany Christian MRN: [id number] DOB: 1931/06/05 Today's Date: 01/01/2017 Time: 1442-1400 SLP Time Calculation (min) (ACUTE ONLY): 1398 min  Assessment / Plan / Recommendation Clinical Impression  SLP followed up for upgraded PO trials though dentures still not available. Pt semi upright lying in chair. Trialed mechanical soft, though trials limited secondary to reduced pt motivation for PO. Pt with prolonged mastication of mech soft consistencies with mild oral residuals.  Recommend continue puree diet with thin liquids until family can provide dentures for adequate mastication.  ST to continue to follow    HPI HPI: Pt is an 83 y.o. female admitted on 12/23/16 after a reported 10-day history of altered mental status since a fall on 3/18. CT showed L SDH with midline shift. Pt now s/p L frontotemporal crani 3/29. Pertinent PMH includes HTN, dementia, cancer.       SLP Plan  Continue with current plan of care       Recommendations  Diet recommendations: Dysphagia 1 (puree);Thin liquid Liquids provided via: Straw Medication Administration: Crushed with puree Supervision: Staff to assist with self feeding;Full supervision/cueing for compensatory strategies Compensations: Small sips/bites;Minimize environmental distractions;Slow rate Postural Changes and/or Swallow Maneuvers: Seated upright 90 degrees                Oral Care Recommendations: Oral care BID Follow up Recommendations: Skilled Nursing facility SLP Visit Diagnosis: Dysphagia, unspecified (R13.10) Plan: Continue with current plan of care       [place] MA, [place] 01/01/2017, 3:11 PM

## 2017-01-01 NOTE — Progress Notes (Signed)
Pt urinated 250 ml at 2300 4/5.

## 2017-01-01 NOTE — Clinical Social Work Placement (Addendum)
   CLINICAL SOCIAL WORK PLACEMENT  NOTE 01/01/17 - DISCHARGED TO [facility] [place] [facility]  Date:  01/01/2017  Patient Details  Name: Brittany Christian MRN: [id number] Date of Birth: [birth year]  Clinical Social Work is seeking post-discharge placement for this patient at the [place] level of care (*CSW will initial, date and re-position this form in  chart as items are completed):  Yes   Patient/family provided with [place] Work Department's list of facilities offering this level of care within the geographic area requested by the patient (or if unable, by the patient's family).  Yes   Patient/family informed of their freedom to choose among providers that offer the needed level of care, that participate in Medicare, Medicaid or managed care program needed by the patient, have an available bed and are willing to accept the patient.  Yes   Patient/family informed of [facility]'s ownership interest in [facility] and [facility], as well as of the fact that they are under no obligation to receive care at these facilities.  PASRR submitted to EDS on 12/29/16     PASRR number received on 12/29/16     Existing PASRR number confirmed on       FL2 transmitted to all facilities in geographic area requested by pt/family on       FL2 transmitted to all facilities within larger geographic area on       Patient informed that his/her managed care company has contracts with or will negotiate with certain facilities, including the following:        Yes   Patient/family informed of bed offers received.  Patient chooses bed at [facility]     Physician recommends and patient chooses bed at      Patient to be transferred to [facility] on  01/01/17.  Patient to be transferred to facility by PTAR     Patient family notified on  01/01/17 of transfer.  Name of family member notified:   [name] and several other  family members at bedside with patient.     PHYSICIAN       Additional Comment:    _______________________________________________ Sable Feil, LCSW 01/01/2017, 5:36 PM

## 2017-01-01 NOTE — Discharge Instructions (Signed)
It has been a pleasure taking care of you! Brittany Christian was admitted due to altered mental status after a fall and was found to have a subdural hematoma that required surgery. She did well after surgery and improved back to her usual self. During her hospital stay, she was evaluated by physical therapy and occupational therapy to need services at either inpatient rehab or a skilled nursing facility.  There could be some changes made to your home medications during this hospitalization. Please, make sure to read the directions before you take them. The names and directions on how to take these medications are found on this discharge paper under medication section.  Please make a hospital follow up appointment at your primary care doctor's office to be seen after leaving the hospital.

## 2017-01-01 NOTE — Progress Notes (Signed)
Spoke with FM resident. Patient ready for DC today, pending bed at SNF. CM provided MD with number to Lorriane Shire CSW facilitating [place]. [phone].

## 2017-01-05 DIAGNOSIS — I10 Essential (primary) hypertension: Secondary | ICD-10-CM | POA: Diagnosis not present

## 2017-01-05 DIAGNOSIS — F0390 Unspecified dementia without behavioral disturbance: Secondary | ICD-10-CM | POA: Diagnosis not present

## 2017-01-05 DIAGNOSIS — S065X9D Traumatic subdural hemorrhage with loss of consciousness of unspecified duration, subsequent encounter: Secondary | ICD-10-CM | POA: Diagnosis not present

## 2017-01-05 DIAGNOSIS — D649 Anemia, unspecified: Secondary | ICD-10-CM | POA: Diagnosis not present

## 2017-01-12 DIAGNOSIS — D649 Anemia, unspecified: Secondary | ICD-10-CM | POA: Diagnosis not present

## 2017-01-12 DIAGNOSIS — F0390 Unspecified dementia without behavioral disturbance: Secondary | ICD-10-CM | POA: Diagnosis not present

## 2017-01-12 DIAGNOSIS — I10 Essential (primary) hypertension: Secondary | ICD-10-CM | POA: Diagnosis not present

## 2017-01-12 DIAGNOSIS — S065X9D Traumatic subdural hemorrhage with loss of consciousness of unspecified duration, subsequent encounter: Secondary | ICD-10-CM | POA: Diagnosis not present

## 2017-01-14 ENCOUNTER — Other Ambulatory Visit: Payer: Self-pay | Admitting: *Deleted

## 2017-01-14 NOTE — Patient Outreach (Signed)
[place] [facility]) Care Management  01/14/2017  Brittany Christian 08/31/1931 [id number]   Met with Brittany Christian, SW at facility. She states patient going home 4/20 with 24 hour care, she has great family support. She has setup home care through [facility]. Patitne has supportive family.   Met with patient and her sons in her room.  Discussed [facility] care management services.  Left packet and RNCM contact. Son said they want to take patient home and they will review the packet and reach out if they need services.  Plan to sign off at this time as patient will be discharging from facility 01/15/17  [employer] E. Laymond Purser, RN, BSN, [place] [phone]) Business Cell  [phone]) Toll Free Office

## 2017-01-20 ENCOUNTER — Other Ambulatory Visit: Payer: Self-pay

## 2017-01-20 NOTE — Patient Outreach (Signed)
[place] [facility]) Care Management  01/20/2017  Brittany Christian 1930/10/17 [id number]     Transition of Care Referral  Referral Date:  01/19/17 Referral Source: [facility] Discharge Report Diagnosis: subdural hematoma and s/p craniotomy Date of Discharge: 01/15/17 Facility: [facility] Insurance: [place] attempt # 1 to patient. No answer at present. RN CM left HIPAA compliant voicemail message along with contact info.      Plan: RN CM will make outreach attempt to patient within one business day if no return call from patient.   Enzo Montgomery, RN,BSN,CCM [place] Management Telephonic Care Management Coordinator Direct Phone: [phone] Toll Free: [phone] Fax: [phone]

## 2017-01-21 ENCOUNTER — Other Ambulatory Visit: Payer: Self-pay

## 2017-01-21 NOTE — Patient Outreach (Signed)
[place] [facility]) Care Management  01/21/2017  Brittany Christian 11/08/1930 [id number]   Transition of Care Referral  Referral Date:  01/19/17 Referral Source: [facility] Discharge Report Diagnosis: subdural hematoma and s/p craniotomy Date of Discharge: 01/15/17 Facility: [facility] Insurance: [employer]     Outreach attempt #2 to patient. No answer at present.     Plan: RN CM will make outreach attempt to patient within one business day if no return call from patient.    Enzo Montgomery, RN,BSN,CCM [place] Management Telephonic Care Management Coordinator Direct Phone: [phone] Toll Free: [phone] Fax: [phone]

## 2017-01-22 ENCOUNTER — Other Ambulatory Visit: Payer: Self-pay

## 2017-01-22 NOTE — Patient Outreach (Signed)
[place] [facility]) Care Management  01/22/2017  Brittany Christian 1931-02-08 [id number]   Transition of Care Referral  Referral Date: 01/19/17 Referral Source: [facility] Discharge Report Diagnosis: subdural hematoma and s/p craniotomy Date of Discharge: 01/15/17 Facility: [facility] Insurance: [employer]     Outreach attempt #3 to patient. No answer at present and voicemail message left.     Plan: RN CM will send unsuccessful outreach letter to patient and close case if no response within 10 business days.   Enzo Montgomery, RN,BSN,CCM [place] Management Telephonic Care Management Coordinator Direct Phone: [phone] Toll Free: [phone] Fax: [phone]

## 2017-01-25 DIAGNOSIS — I6200 Nontraumatic subdural hemorrhage, unspecified: Secondary | ICD-10-CM | POA: Diagnosis not present

## 2017-02-02 DIAGNOSIS — I10 Essential (primary) hypertension: Secondary | ICD-10-CM | POA: Diagnosis not present

## 2017-02-02 DIAGNOSIS — K5900 Constipation, unspecified: Secondary | ICD-10-CM | POA: Diagnosis not present

## 2017-02-02 DIAGNOSIS — Z9181 History of falling: Secondary | ICD-10-CM | POA: Diagnosis not present

## 2017-02-02 DIAGNOSIS — D649 Anemia, unspecified: Secondary | ICD-10-CM | POA: Diagnosis not present

## 2017-02-02 DIAGNOSIS — F0391 Unspecified dementia with behavioral disturbance: Secondary | ICD-10-CM | POA: Diagnosis not present

## 2017-02-04 DIAGNOSIS — D649 Anemia, unspecified: Secondary | ICD-10-CM | POA: Diagnosis not present

## 2017-02-04 DIAGNOSIS — F0391 Unspecified dementia with behavioral disturbance: Secondary | ICD-10-CM | POA: Diagnosis not present

## 2017-02-04 DIAGNOSIS — I10 Essential (primary) hypertension: Secondary | ICD-10-CM | POA: Diagnosis not present

## 2017-02-04 DIAGNOSIS — Z9181 History of falling: Secondary | ICD-10-CM | POA: Diagnosis not present

## 2017-02-04 DIAGNOSIS — K5900 Constipation, unspecified: Secondary | ICD-10-CM | POA: Diagnosis not present

## 2017-02-05 ENCOUNTER — Other Ambulatory Visit: Payer: Self-pay

## 2017-02-05 NOTE — Patient Outreach (Signed)
[facility]) Care Management  02/05/2017  Brittany Christian 09/13/1931 [id number]   Transition of Care Referral  Referral Date: 01/19/17 Referral Source: [facility] Discharge Report Diagnosis: subdural hematoma and s/p craniotomy Date of Discharge: 01/15/17 Facility: [facility] Insurance: [employer]   Multiple attempts to establish contact with patient without success. No response from letter mailed to patient. Case is being closed at this time.    Plan: RN CM will notify [facility] administrative assistant of case status. RN CM will send MD case closure letter.   Enzo Montgomery, RN,BSN,CCM [place] Management Telephonic Care Management Coordinator Direct Phone: [phone] Toll Free: [phone] Fax: [phone] \

## 2017-02-11 DIAGNOSIS — Z9181 History of falling: Secondary | ICD-10-CM | POA: Diagnosis not present

## 2017-02-11 DIAGNOSIS — K5900 Constipation, unspecified: Secondary | ICD-10-CM | POA: Diagnosis not present

## 2017-02-11 DIAGNOSIS — F0391 Unspecified dementia with behavioral disturbance: Secondary | ICD-10-CM | POA: Diagnosis not present

## 2017-02-11 DIAGNOSIS — I10 Essential (primary) hypertension: Secondary | ICD-10-CM | POA: Diagnosis not present

## 2017-02-11 DIAGNOSIS — D649 Anemia, unspecified: Secondary | ICD-10-CM | POA: Diagnosis not present

## 2017-02-12 DIAGNOSIS — I10 Essential (primary) hypertension: Secondary | ICD-10-CM | POA: Diagnosis not present

## 2017-02-12 DIAGNOSIS — K5900 Constipation, unspecified: Secondary | ICD-10-CM | POA: Diagnosis not present

## 2017-02-12 DIAGNOSIS — F0391 Unspecified dementia with behavioral disturbance: Secondary | ICD-10-CM | POA: Diagnosis not present

## 2017-02-12 DIAGNOSIS — Z9181 History of falling: Secondary | ICD-10-CM | POA: Diagnosis not present

## 2017-02-12 DIAGNOSIS — D649 Anemia, unspecified: Secondary | ICD-10-CM | POA: Diagnosis not present

## 2017-02-17 DIAGNOSIS — F0391 Unspecified dementia with behavioral disturbance: Secondary | ICD-10-CM | POA: Diagnosis not present

## 2017-02-17 DIAGNOSIS — Z9181 History of falling: Secondary | ICD-10-CM | POA: Diagnosis not present

## 2017-02-17 DIAGNOSIS — D649 Anemia, unspecified: Secondary | ICD-10-CM | POA: Diagnosis not present

## 2017-02-17 DIAGNOSIS — I10 Essential (primary) hypertension: Secondary | ICD-10-CM | POA: Diagnosis not present

## 2017-02-17 DIAGNOSIS — K5900 Constipation, unspecified: Secondary | ICD-10-CM | POA: Diagnosis not present

## 2017-02-19 DIAGNOSIS — I10 Essential (primary) hypertension: Secondary | ICD-10-CM | POA: Diagnosis not present

## 2017-02-19 DIAGNOSIS — D649 Anemia, unspecified: Secondary | ICD-10-CM | POA: Diagnosis not present

## 2017-02-19 DIAGNOSIS — K5900 Constipation, unspecified: Secondary | ICD-10-CM | POA: Diagnosis not present

## 2017-02-19 DIAGNOSIS — F0391 Unspecified dementia with behavioral disturbance: Secondary | ICD-10-CM | POA: Diagnosis not present

## 2017-02-19 DIAGNOSIS — Z9181 History of falling: Secondary | ICD-10-CM | POA: Diagnosis not present

## 2017-02-22 DIAGNOSIS — F0391 Unspecified dementia with behavioral disturbance: Secondary | ICD-10-CM | POA: Diagnosis not present

## 2017-02-22 DIAGNOSIS — Z9181 History of falling: Secondary | ICD-10-CM | POA: Diagnosis not present

## 2017-02-22 DIAGNOSIS — I10 Essential (primary) hypertension: Secondary | ICD-10-CM | POA: Diagnosis not present

## 2017-02-22 DIAGNOSIS — D649 Anemia, unspecified: Secondary | ICD-10-CM | POA: Diagnosis not present

## 2017-02-22 DIAGNOSIS — K5900 Constipation, unspecified: Secondary | ICD-10-CM | POA: Diagnosis not present

## 2017-03-01 DIAGNOSIS — D649 Anemia, unspecified: Secondary | ICD-10-CM | POA: Diagnosis not present

## 2017-03-01 DIAGNOSIS — I10 Essential (primary) hypertension: Secondary | ICD-10-CM | POA: Diagnosis not present

## 2017-03-01 DIAGNOSIS — F0391 Unspecified dementia with behavioral disturbance: Secondary | ICD-10-CM | POA: Diagnosis not present

## 2017-03-01 DIAGNOSIS — Z9181 History of falling: Secondary | ICD-10-CM | POA: Diagnosis not present

## 2017-03-01 DIAGNOSIS — K5900 Constipation, unspecified: Secondary | ICD-10-CM | POA: Diagnosis not present

## 2017-03-12 DIAGNOSIS — Z9181 History of falling: Secondary | ICD-10-CM | POA: Diagnosis not present

## 2017-03-12 DIAGNOSIS — K5900 Constipation, unspecified: Secondary | ICD-10-CM | POA: Diagnosis not present

## 2017-03-12 DIAGNOSIS — D649 Anemia, unspecified: Secondary | ICD-10-CM | POA: Diagnosis not present

## 2017-03-12 DIAGNOSIS — I10 Essential (primary) hypertension: Secondary | ICD-10-CM | POA: Diagnosis not present

## 2017-03-12 DIAGNOSIS — F0391 Unspecified dementia with behavioral disturbance: Secondary | ICD-10-CM | POA: Diagnosis not present

## 2017-03-17 DIAGNOSIS — F0391 Unspecified dementia with behavioral disturbance: Secondary | ICD-10-CM | POA: Diagnosis not present

## 2017-03-17 DIAGNOSIS — D649 Anemia, unspecified: Secondary | ICD-10-CM | POA: Diagnosis not present

## 2017-03-17 DIAGNOSIS — K5900 Constipation, unspecified: Secondary | ICD-10-CM | POA: Diagnosis not present

## 2017-03-17 DIAGNOSIS — Z9181 History of falling: Secondary | ICD-10-CM | POA: Diagnosis not present

## 2017-03-17 DIAGNOSIS — I10 Essential (primary) hypertension: Secondary | ICD-10-CM | POA: Diagnosis not present

## 2017-03-24 DIAGNOSIS — I10 Essential (primary) hypertension: Secondary | ICD-10-CM | POA: Diagnosis not present

## 2017-03-24 DIAGNOSIS — D649 Anemia, unspecified: Secondary | ICD-10-CM | POA: Diagnosis not present

## 2017-03-24 DIAGNOSIS — K5900 Constipation, unspecified: Secondary | ICD-10-CM | POA: Diagnosis not present

## 2017-03-24 DIAGNOSIS — F0391 Unspecified dementia with behavioral disturbance: Secondary | ICD-10-CM | POA: Diagnosis not present

## 2017-03-24 DIAGNOSIS — Z9181 History of falling: Secondary | ICD-10-CM | POA: Diagnosis not present

## 2017-04-27 DIAGNOSIS — R112 Nausea with vomiting, unspecified: Secondary | ICD-10-CM | POA: Diagnosis not present

## 2017-04-27 DIAGNOSIS — E538 Deficiency of other specified B group vitamins: Secondary | ICD-10-CM | POA: Diagnosis not present

## 2017-04-27 DIAGNOSIS — I10 Essential (primary) hypertension: Secondary | ICD-10-CM | POA: Diagnosis not present

## 2017-05-13 DIAGNOSIS — E7800 Pure hypercholesterolemia, unspecified: Secondary | ICD-10-CM | POA: Diagnosis not present

## 2017-05-13 DIAGNOSIS — E538 Deficiency of other specified B group vitamins: Secondary | ICD-10-CM | POA: Diagnosis not present

## 2017-05-13 DIAGNOSIS — E049 Nontoxic goiter, unspecified: Secondary | ICD-10-CM | POA: Diagnosis not present

## 2017-05-13 DIAGNOSIS — I10 Essential (primary) hypertension: Secondary | ICD-10-CM | POA: Diagnosis not present

## 2017-11-22 DIAGNOSIS — Z1329 Encounter for screening for other suspected endocrine disorder: Secondary | ICD-10-CM | POA: Diagnosis not present

## 2017-11-22 DIAGNOSIS — R413 Other amnesia: Secondary | ICD-10-CM | POA: Diagnosis not present

## 2017-11-22 DIAGNOSIS — E538 Deficiency of other specified B group vitamins: Secondary | ICD-10-CM | POA: Diagnosis not present

## 2017-11-22 DIAGNOSIS — E7800 Pure hypercholesterolemia, unspecified: Secondary | ICD-10-CM | POA: Diagnosis not present

## 2017-11-22 DIAGNOSIS — I10 Essential (primary) hypertension: Secondary | ICD-10-CM | POA: Diagnosis not present

## 2017-12-29 DIAGNOSIS — E538 Deficiency of other specified B group vitamins: Secondary | ICD-10-CM | POA: Diagnosis not present

## 2017-12-29 DIAGNOSIS — E7800 Pure hypercholesterolemia, unspecified: Secondary | ICD-10-CM | POA: Diagnosis not present

## 2017-12-29 DIAGNOSIS — F09 Unspecified mental disorder due to known physiological condition: Secondary | ICD-10-CM | POA: Diagnosis not present

## 2017-12-29 DIAGNOSIS — I10 Essential (primary) hypertension: Secondary | ICD-10-CM | POA: Diagnosis not present

## 2018-02-17 DIAGNOSIS — G309 Alzheimer's disease, unspecified: Secondary | ICD-10-CM | POA: Diagnosis not present

## 2018-02-17 DIAGNOSIS — F0280 Dementia in other diseases classified elsewhere without behavioral disturbance: Secondary | ICD-10-CM | POA: Diagnosis not present

## 2018-02-17 DIAGNOSIS — S065X9A Traumatic subdural hemorrhage with loss of consciousness of unspecified duration, initial encounter: Secondary | ICD-10-CM | POA: Diagnosis not present

## 2018-02-17 DIAGNOSIS — R413 Other amnesia: Secondary | ICD-10-CM | POA: Diagnosis not present

## 2018-02-17 DIAGNOSIS — E538 Deficiency of other specified B group vitamins: Secondary | ICD-10-CM | POA: Diagnosis not present

## 2018-02-17 DIAGNOSIS — F0150 Vascular dementia without behavioral disturbance: Secondary | ICD-10-CM | POA: Diagnosis not present

## 2018-02-22 ENCOUNTER — Other Ambulatory Visit: Payer: Self-pay | Admitting: Neurology

## 2018-02-22 DIAGNOSIS — Z8679 Personal history of other diseases of the circulatory system: Secondary | ICD-10-CM

## 2018-03-03 ENCOUNTER — Ambulatory Visit: Payer: Medicare HMO | Attending: Neurology

## 2018-03-14 ENCOUNTER — Ambulatory Visit: Payer: Medicare HMO | Attending: Neurology

## 2018-04-01 DIAGNOSIS — E538 Deficiency of other specified B group vitamins: Secondary | ICD-10-CM | POA: Diagnosis not present

## 2018-04-01 DIAGNOSIS — F0150 Vascular dementia without behavioral disturbance: Secondary | ICD-10-CM | POA: Diagnosis not present

## 2018-04-01 DIAGNOSIS — E049 Nontoxic goiter, unspecified: Secondary | ICD-10-CM | POA: Diagnosis not present

## 2018-04-01 DIAGNOSIS — F0280 Dementia in other diseases classified elsewhere without behavioral disturbance: Secondary | ICD-10-CM | POA: Diagnosis not present

## 2018-04-01 DIAGNOSIS — E7800 Pure hypercholesterolemia, unspecified: Secondary | ICD-10-CM | POA: Diagnosis not present

## 2018-04-01 DIAGNOSIS — Z0000 Encounter for general adult medical examination without abnormal findings: Secondary | ICD-10-CM | POA: Diagnosis not present

## 2018-04-01 DIAGNOSIS — G309 Alzheimer's disease, unspecified: Secondary | ICD-10-CM | POA: Diagnosis not present

## 2018-04-01 DIAGNOSIS — I10 Essential (primary) hypertension: Secondary | ICD-10-CM | POA: Diagnosis not present

## 2018-07-20 DIAGNOSIS — E538 Deficiency of other specified B group vitamins: Secondary | ICD-10-CM | POA: Diagnosis not present

## 2018-07-20 DIAGNOSIS — G309 Alzheimer's disease, unspecified: Secondary | ICD-10-CM | POA: Diagnosis not present

## 2018-07-20 DIAGNOSIS — C50919 Malignant neoplasm of unspecified site of unspecified female breast: Secondary | ICD-10-CM | POA: Diagnosis not present

## 2018-07-20 DIAGNOSIS — Z23 Encounter for immunization: Secondary | ICD-10-CM | POA: Diagnosis not present

## 2018-07-20 DIAGNOSIS — F0280 Dementia in other diseases classified elsewhere without behavioral disturbance: Secondary | ICD-10-CM | POA: Diagnosis not present

## 2018-07-20 DIAGNOSIS — I10 Essential (primary) hypertension: Secondary | ICD-10-CM | POA: Diagnosis not present

## 2018-07-20 DIAGNOSIS — E7800 Pure hypercholesterolemia, unspecified: Secondary | ICD-10-CM | POA: Diagnosis not present

## 2018-07-20 DIAGNOSIS — F0150 Vascular dementia without behavioral disturbance: Secondary | ICD-10-CM | POA: Diagnosis not present

## 2018-11-17 DIAGNOSIS — F0150 Vascular dementia without behavioral disturbance: Secondary | ICD-10-CM | POA: Diagnosis not present

## 2018-11-17 DIAGNOSIS — I10 Essential (primary) hypertension: Secondary | ICD-10-CM | POA: Diagnosis not present

## 2018-11-17 DIAGNOSIS — Z0000 Encounter for general adult medical examination without abnormal findings: Secondary | ICD-10-CM | POA: Diagnosis not present

## 2018-11-17 DIAGNOSIS — C50919 Malignant neoplasm of unspecified site of unspecified female breast: Secondary | ICD-10-CM | POA: Diagnosis not present

## 2018-11-17 DIAGNOSIS — S065X9A Traumatic subdural hemorrhage with loss of consciousness of unspecified duration, initial encounter: Secondary | ICD-10-CM | POA: Diagnosis not present

## 2018-11-17 DIAGNOSIS — G309 Alzheimer's disease, unspecified: Secondary | ICD-10-CM | POA: Diagnosis not present

## 2018-11-17 DIAGNOSIS — E538 Deficiency of other specified B group vitamins: Secondary | ICD-10-CM | POA: Diagnosis not present

## 2018-11-17 DIAGNOSIS — E7800 Pure hypercholesterolemia, unspecified: Secondary | ICD-10-CM | POA: Diagnosis not present

## 2018-11-17 DIAGNOSIS — F0280 Dementia in other diseases classified elsewhere without behavioral disturbance: Secondary | ICD-10-CM | POA: Diagnosis not present

## 2019-02-27 DIAGNOSIS — R600 Localized edema: Secondary | ICD-10-CM | POA: Diagnosis not present

## 2019-04-22 ENCOUNTER — Emergency Department (HOSPITAL_COMMUNITY): Payer: Medicare HMO

## 2019-04-22 ENCOUNTER — Other Ambulatory Visit: Payer: Self-pay

## 2019-04-22 ENCOUNTER — Inpatient Hospital Stay (HOSPITAL_COMMUNITY)
Admission: EM | Admit: 2019-04-22 | Discharge: 2019-04-25 | DRG: 175 | Disposition: A | Discharge status: Hospice | Payer: Medicare HMO | Attending: Internal Medicine | Admitting: Internal Medicine

## 2019-04-22 DIAGNOSIS — Z66 Do not resuscitate: Secondary | ICD-10-CM | POA: Diagnosis not present

## 2019-04-22 DIAGNOSIS — R4020 Unspecified coma: Secondary | ICD-10-CM | POA: Diagnosis not present

## 2019-04-22 DIAGNOSIS — I82462 Acute embolism and thrombosis of left calf muscular vein: Secondary | ICD-10-CM | POA: Diagnosis present

## 2019-04-22 DIAGNOSIS — Z03818 Encounter for observation for suspected exposure to other biological agents ruled out: Secondary | ICD-10-CM | POA: Diagnosis not present

## 2019-04-22 DIAGNOSIS — E861 Hypovolemia: Secondary | ICD-10-CM | POA: Diagnosis present

## 2019-04-22 DIAGNOSIS — R55 Syncope and collapse: Secondary | ICD-10-CM | POA: Diagnosis present

## 2019-04-22 DIAGNOSIS — I2694 Multiple subsegmental pulmonary emboli without acute cor pulmonale: Secondary | ICD-10-CM | POA: Diagnosis present

## 2019-04-22 DIAGNOSIS — I959 Hypotension, unspecified: Secondary | ICD-10-CM | POA: Diagnosis present

## 2019-04-22 DIAGNOSIS — Z90710 Acquired absence of both cervix and uterus: Secondary | ICD-10-CM | POA: Diagnosis not present

## 2019-04-22 DIAGNOSIS — Z9221 Personal history of antineoplastic chemotherapy: Secondary | ICD-10-CM

## 2019-04-22 DIAGNOSIS — I10 Essential (primary) hypertension: Secondary | ICD-10-CM | POA: Diagnosis not present

## 2019-04-22 DIAGNOSIS — Z87820 Personal history of traumatic brain injury: Secondary | ICD-10-CM

## 2019-04-22 DIAGNOSIS — E872 Acidosis, unspecified: Secondary | ICD-10-CM | POA: Diagnosis present

## 2019-04-22 DIAGNOSIS — T68XXXA Hypothermia, initial encounter: Secondary | ICD-10-CM | POA: Diagnosis present

## 2019-04-22 DIAGNOSIS — Z79899 Other long term (current) drug therapy: Secondary | ICD-10-CM | POA: Diagnosis not present

## 2019-04-22 DIAGNOSIS — G9341 Metabolic encephalopathy: Secondary | ICD-10-CM | POA: Diagnosis not present

## 2019-04-22 DIAGNOSIS — I2699 Other pulmonary embolism without acute cor pulmonale: Secondary | ICD-10-CM | POA: Diagnosis not present

## 2019-04-22 DIAGNOSIS — R9431 Abnormal electrocardiogram [ECG] [EKG]: Secondary | ICD-10-CM | POA: Diagnosis present

## 2019-04-22 DIAGNOSIS — I340 Nonrheumatic mitral (valve) insufficiency: Secondary | ICD-10-CM | POA: Diagnosis not present

## 2019-04-22 DIAGNOSIS — Z8249 Family history of ischemic heart disease and other diseases of the circulatory system: Secondary | ICD-10-CM

## 2019-04-22 DIAGNOSIS — E785 Hyperlipidemia, unspecified: Secondary | ICD-10-CM | POA: Diagnosis not present

## 2019-04-22 DIAGNOSIS — R7989 Other specified abnormal findings of blood chemistry: Secondary | ICD-10-CM | POA: Diagnosis present

## 2019-04-22 DIAGNOSIS — J9601 Acute respiratory failure with hypoxia: Secondary | ICD-10-CM | POA: Diagnosis present

## 2019-04-22 DIAGNOSIS — D649 Anemia, unspecified: Secondary | ICD-10-CM | POA: Diagnosis present

## 2019-04-22 DIAGNOSIS — R680 Hypothermia, not associated with low environmental temperature: Secondary | ICD-10-CM | POA: Diagnosis present

## 2019-04-22 DIAGNOSIS — I2692 Saddle embolus of pulmonary artery without acute cor pulmonale: Secondary | ICD-10-CM | POA: Diagnosis not present

## 2019-04-22 DIAGNOSIS — Z853 Personal history of malignant neoplasm of breast: Secondary | ICD-10-CM | POA: Diagnosis not present

## 2019-04-22 DIAGNOSIS — F0390 Unspecified dementia without behavioral disturbance: Secondary | ICD-10-CM | POA: Diagnosis present

## 2019-04-22 DIAGNOSIS — I361 Nonrheumatic tricuspid (valve) insufficiency: Secondary | ICD-10-CM | POA: Diagnosis not present

## 2019-04-22 DIAGNOSIS — I82451 Acute embolism and thrombosis of right peroneal vein: Secondary | ICD-10-CM | POA: Diagnosis not present

## 2019-04-22 DIAGNOSIS — I2602 Saddle embolus of pulmonary artery with acute cor pulmonale: Secondary | ICD-10-CM | POA: Diagnosis not present

## 2019-04-22 DIAGNOSIS — Z20828 Contact with and (suspected) exposure to other viral communicable diseases: Secondary | ICD-10-CM | POA: Diagnosis present

## 2019-04-22 DIAGNOSIS — R0902 Hypoxemia: Secondary | ICD-10-CM | POA: Diagnosis not present

## 2019-04-22 DIAGNOSIS — M7989 Other specified soft tissue disorders: Secondary | ICD-10-CM | POA: Diagnosis not present

## 2019-04-22 DIAGNOSIS — Z9011 Acquired absence of right breast and nipple: Secondary | ICD-10-CM

## 2019-04-22 DIAGNOSIS — R778 Other specified abnormalities of plasma proteins: Secondary | ICD-10-CM | POA: Diagnosis present

## 2019-04-22 LAB — COMPREHENSIVE METABOLIC PANEL
ALT: 17 U/L (ref 0–44)
AST: 28 U/L (ref 15–41)
Albumin: 2.6 g/dL — ABNORMAL LOW (ref 3.5–5.0)
Alkaline Phosphatase: 57 U/L (ref 38–126)
Anion gap: 11 (ref 5–15)
BUN: 12 mg/dL (ref 8–23)
CO2: 19 mmol/L — ABNORMAL LOW (ref 22–32)
Calcium: 8.6 mg/dL — ABNORMAL LOW (ref 8.9–10.3)
Chloride: 107 mmol/L (ref 98–111)
Creatinine, Ser: 0.82 mg/dL (ref 0.44–1.00)
GFR calc Af Amer: >60 mL/min (ref >60)
GFR calc non Af Amer: >60 mL/min (ref >60)
Glucose, Bld: 192 mg/dL — ABNORMAL HIGH (ref 70–99)
Potassium: 3.7 mmol/L (ref 3.5–5.1)
Sodium: 137 mmol/L (ref 135–145)
Total Bilirubin: 0.8 mg/dL (ref 0.3–1.2)
Total Protein: 5.5 g/dL — ABNORMAL LOW (ref 6.5–8.1)

## 2019-04-22 LAB — URINALYSIS, COMPLETE (UACMP) WITH MICROSCOPIC
Bilirubin Urine: NEGATIVE
Glucose, UA: NEGATIVE mg/dL
Hgb urine dipstick: NEGATIVE
Ketones, ur: NEGATIVE mg/dL
Leukocytes,Ua: NEGATIVE
Nitrite: NEGATIVE
Protein, ur: NEGATIVE mg/dL
Specific Gravity, Urine: 1.003 — ABNORMAL LOW (ref 1.005–1.030)
pH: 8 (ref 5.0–8.0)

## 2019-04-22 LAB — CBC WITH DIFFERENTIAL/PLATELET
Abs Immature Granulocytes: 0.03 10*3/uL (ref 0.00–0.07)
Basophils Absolute: 0 10*3/uL (ref 0.0–0.1)
Basophils Relative: 1 %
Eosinophils Absolute: 0.2 10*3/uL (ref 0.0–0.5)
Eosinophils Relative: 3 %
HCT: 28.3 % — ABNORMAL LOW (ref 36.0–46.0)
Hemoglobin: 8.5 g/dL — ABNORMAL LOW (ref 12.0–15.0)
Immature Granulocytes: 1 %
Lymphocytes Relative: 30 %
Lymphs Abs: 1.7 10*3/uL (ref 0.7–4.0)
MCH: 26.4 pg (ref 26.0–34.0)
MCHC: 30 g/dL (ref 30.0–36.0)
MCV: 87.9 fL (ref 80.0–100.0)
Monocytes Absolute: 0.6 10*3/uL (ref 0.1–1.0)
Monocytes Relative: 11 %
Neutro Abs: 3.1 10*3/uL (ref 1.7–7.7)
Neutrophils Relative %: 54 %
Platelets: 172 10*3/uL (ref 150–400)
RBC: 3.22 MIL/uL — ABNORMAL LOW (ref 3.87–5.11)
RDW: 13.4 % (ref 11.5–15.5)
WBC: 5.6 10*3/uL (ref 4.0–10.5)
nRBC: 0 % (ref 0.0–0.2)

## 2019-04-22 LAB — TROPONIN I (HIGH SENSITIVITY)
Troponin I (High Sensitivity): 12 ng/L (ref <18)
Troponin I (High Sensitivity): 39 ng/L — ABNORMAL HIGH (ref <18)

## 2019-04-22 LAB — LACTIC ACID, PLASMA
Lactic Acid, Venous: 3.3 mmol/L (ref 0.5–1.9)
Lactic Acid, Venous: 1.6 mmol/L (ref 0.5–1.9)

## 2019-04-22 LAB — CORTISOL-AM, BLOOD: Cortisol - AM: 17 ug/dL (ref 6.7–22.6)

## 2019-04-22 LAB — CBG MONITORING, ED: Glucose-Capillary: 160 mg/dL — ABNORMAL HIGH (ref 70–99)

## 2019-04-22 LAB — PROCALCITONIN: Procalcitonin: <0.1 ng/mL

## 2019-04-22 LAB — BRAIN NATRIURETIC PEPTIDE: B Natriuretic Peptide: 62.4 pg/mL (ref 0.0–100.0)

## 2019-04-22 LAB — SARS CORONAVIRUS 2 BY RT PCR (HOSPITAL ORDER, PERFORMED IN ~~LOC~~ HOSPITAL LAB): SARS Coronavirus 2: NEGATIVE

## 2019-04-22 MED ORDER — SODIUM CHLORIDE 0.9 % IV BOLUS
1000.0000 mL | Freq: Once | INTRAVENOUS | Status: AC
  Administered 2019-04-22: 1000 mL via INTRAVENOUS

## 2019-04-22 MED ORDER — ACETAMINOPHEN 325 MG PO TABS: 650.0000 mg | ORAL_TABLET | Freq: Four times a day (QID) | ORAL | Status: DC | PRN

## 2019-04-22 MED ORDER — ONDANSETRON HCL 4 MG/2ML IJ SOLN: 4.0000 mg | Freq: Four times a day (QID) | INTRAMUSCULAR | Status: DC | PRN

## 2019-04-22 MED ORDER — SODIUM CHLORIDE 0.9% FLUSH
3.0000 mL | Freq: Two times a day (BID) | INTRAVENOUS | Status: DC
  Administered 2019-04-23 – 2019-04-24 (×2): 3 mL via INTRAVENOUS

## 2019-04-22 MED ORDER — ACETAMINOPHEN 650 MG RE SUPP: 650.0000 mg | Freq: Four times a day (QID) | RECTAL | Status: DC | PRN

## 2019-04-22 MED ORDER — VITAMIN B-12 1000 MCG PO TABS
1000.0000 ug | ORAL_TABLET | Freq: Every day | ORAL | Status: DC
  Administered 2019-04-23 – 2019-04-25 (×3): 1000 ug via ORAL
  Filled 2019-04-22 (×3): qty 1

## 2019-04-22 MED ORDER — ATORVASTATIN CALCIUM 40 MG PO TABS
40.0000 mg | ORAL_TABLET | Freq: Every day | ORAL | Status: DC
  Administered 2019-04-23 – 2019-04-24 (×3): 40 mg via ORAL
  Filled 2019-04-22 (×3): qty 1

## 2019-04-22 MED ORDER — ASPIRIN EC 325 MG PO TBEC
325.0000 mg | DELAYED_RELEASE_TABLET | Freq: Every day | ORAL | Status: DC
  Administered 2019-04-23: 325 mg via ORAL
  Filled 2019-04-22: qty 1

## 2019-04-22 MED ORDER — HYDROCORTISONE NA SUCCINATE PF 100 MG IJ SOLR
50.0000 mg | Freq: Once | INTRAMUSCULAR | Status: AC
  Administered 2019-04-22: 50 mg via INTRAVENOUS
  Filled 2019-04-22: qty 2

## 2019-04-22 MED ORDER — LEVALBUTEROL HCL 1.25 MG/0.5ML IN NEBU
1.2500 mg | INHALATION_SOLUTION | Freq: Four times a day (QID) | RESPIRATORY_TRACT | Status: DC
  Administered 2019-04-23 (×2): 1.25 mg via RESPIRATORY_TRACT
  Filled 2019-04-22 (×2): qty 0.5

## 2019-04-22 MED ORDER — ENOXAPARIN SODIUM 40 MG/0.4ML ~~LOC~~ SOLN: 40.0000 mg | SUBCUTANEOUS | Status: DC

## 2019-04-22 MED ORDER — ONDANSETRON HCL 4 MG PO TABS: 4.0000 mg | ORAL_TABLET | Freq: Four times a day (QID) | ORAL | Status: DC | PRN

## 2019-04-22 NOTE — Progress Notes (Signed)
ANTICOAGULATION CONSULT NOTE - Initial Consult  Pharmacy Consult for heparin Indication: PE vs ACS  No Known Allergies  Patient Measurements:   Heparin Dosing Weight: 49kg  Vital Signs: Temp: 96.3 F (35.7 C) (07/25 2336) Temp Source: Rectal (07/25 2336) BP: 143/74 (07/25 2230) Pulse Rate: 110 (07/25 1711)  Labs: Recent Labs    04/22/19 1752 04/22/19 1840 04/22/19 2217  HGB 8.5*  --   --   HCT 28.3*  --   --   PLT 172  --   --   CREATININE 0.82  --   --   TROPONINIHS  --  12 39*    CrCl cannot be calculated (Unknown ideal weight.).   Medical History: Past Medical History:  Diagnosis Date  . Cancer ([place])   . Hypertension     Assessment: 59 yoF admitted with syncope. Pharmacy asked to start pt on heparin drip given rising Hs-troponins and concern for potential PE vs ACS. D-dimer pending, CT chest to be ordered pending result. Note pt has hx of SDH 11/2016, however current CT head negative. No OAC noted PTA.  Goal of Therapy:  Heparin level 0.3-0.7 units/ml Monitor platelets by anticoagulation protocol: Yes   Plan:  -Heparin 2000 units x1 - giving lower bolus with recent fall and hx SDH -Heparin 750 units/hr -Check 8-hr heparin level -Monitor heparin level, CBC, S/Sx bleeding daily  Arrie Senate, PharmD, BCPS Clinical Pharmacist Please check AMION for all [place] numbers 04/23/2019

## 2019-04-22 NOTE — ED Notes (Signed)
ED Provider at bedside. 

## 2019-04-22 NOTE — ED Notes (Signed)
Patient transported to CT 

## 2019-04-22 NOTE — ED Triage Notes (Signed)
Pt arrives with [facility] from home; family called EMS after helping pt to restroom; pt lost consciousness while having a bowel movement and became unresponsive. Family states pt did not hit head as they held her up. EMS reports pt lost consciousness for approx 10-13 mins. Pt has hx of dementia. GCS on EMS arrival was 3 and BP was 62/30. 1500 ml fluids given by EMS; BP increased to 100/70. Pt was 89% on RA; placed on 2L O2 by EMS, O2 sat 97%.

## 2019-04-22 NOTE — ED Provider Notes (Signed)
[facility]GENCY DEPARTMENT Provider Note   CSN: [id number] Arrival date & time: 04/22/19  1650    History   Chief Complaint Chief Complaint  Patient presents with   Loss of Consciousness    HPI Brittany Christian is a 85 y.o. female with a past medical history of prior SDH, hypertensive urgency who presents to ED for loss of consciousness. 51 of history is provided by son on phone.  He states that he helped the patient get onto the toilet.  He left the bathroom, went to sit on the bed and heard her fall onto the floor.  States that she was unconscious for about 10 minutes.  He was able to help her up.  States that at baseline, she is unable to hold a conversation due to her dementia.  She needs help with all of her ADLs.  Denies any sick contact at home, vomiting, diarrhea, complaints of chest pain or shortness of breath, fever.     HPI  Past Medical History:  Diagnosis Date   Cancer [facility])    Hypertension     Patient Active Problem List   Diagnosis Date Noted   Impairment of cognitive function    Closed head injury    Acute respiratory failure with hypoxia ([place])    Hypomagnesemia    Subdural hematoma ([place]) 12/24/2016   Altered mental status    SDH (subdural hematoma) ([facility])    AKI (acute kidney injury) ([place])    Hypertensive urgency 05/10/2016    Past Surgical History:  Procedure Laterality Date   ABDOMINAL HYSTERECTOMY     CRANIOTOMY Left 12/24/2016   Procedure: CRANIOTOMY HEMATOMA EVACUATION SUBDURAL;  Surgeon: Kevan Ny Ditty, MD;  Location: [facility];  Service: Neurosurgery;  Laterality: Left;   MASTECTOMY       OB History   No obstetric history on file.      Home Medications    Prior to Admission medications   Medication Sig Start Date End Date Taking? Authorizing Provider  feeding supplement, ENSURE ENLIVE, (ENSURE ENLIVE) LIQD Take 237 mLs by mouth 2 (two) times daily between meals. 01/01/17   [place] Bing,  DO  polyethylene glycol (MIRALAX / GLYCOLAX) packet Take 17 g by mouth daily as needed for mild constipation. 01/01/17   [place] Bing, DO  verapamil (CALAN-SR) 240 MG CR tablet Take 240 mg by mouth daily. 04/21/16   [provider]    Family History Family History  Problem Relation Age of Onset   Hypertension Other     Social History Social History   Tobacco Use   Smoking status: Never Smoker   Smokeless tobacco: Never Used  Substance Use Topics   Alcohol use: No   Drug use: No     Allergies   Patient has no known allergies.   Review of Systems Review of Systems  Unable to perform ROS: Dementia     Physical Exam Updated Vital Signs BP (!) 161/69    Pulse (!) 110    Temp (!) 96.3 F (35.7 C) (Rectal)    Resp 13    SpO2 96%   Physical Exam Vitals signs and nursing note reviewed.  Constitutional:      General: She is not in acute distress.    Appearance: She is well-developed.     Comments: Pleasantly demented.  HENT:     Head: Normocephalic and atraumatic.     Nose: Nose normal.  Eyes:     General: No scleral  icterus.       Right eye: No discharge.        Left eye: No discharge.     Conjunctiva/sclera: Conjunctivae normal.     Pupils: Pupils are equal, round, and reactive to light.  Neck:     Musculoskeletal: Normal range of motion and neck supple.  Cardiovascular:     Rate and Rhythm: Regular rhythm. Tachycardia present.     Heart sounds: Normal heart sounds. No murmur. No friction rub. No gallop.   Pulmonary:     Effort: Pulmonary effort is normal. No respiratory distress.     Breath sounds: Normal breath sounds.  Abdominal:     General: Bowel sounds are normal. There is no distension.     Palpations: Abdomen is soft.     Tenderness: There is no abdominal tenderness. There is no guarding.  Musculoskeletal: Normal range of motion.  Skin:    General: Skin is warm and dry.     Findings: No rash.  Neurological:     Mental Status: She  is alert. Mental status is at baseline. She is disoriented.     Cranial Nerves: No cranial nerve deficit.     Motor: No weakness or abnormal muscle tone.     Coordination: Coordination normal.     Comments: Unwilling to participate in neurological exam.      ED Treatments / Results  Labs (all labs ordered are listed, but only abnormal results are displayed) Labs Reviewed  COMPREHENSIVE METABOLIC PANEL - Abnormal; Notable for the following components:      Result Value   CO2 19 (*)    Glucose, Bld 192 (*)    Calcium 8.6 (*)    Total Protein 5.5 (*)    Albumin 2.6 (*)    All other components within normal limits  CBC WITH DIFFERENTIAL/PLATELET - Abnormal; Notable for the following components:   RBC 3.22 (*)    Hemoglobin 8.5 (*)    HCT 28.3 (*)    All other components within normal limits  URINALYSIS, COMPLETE (UACMP) WITH MICROSCOPIC - Abnormal; Notable for the following components:   Color, Urine COLORLESS (*)    Specific Gravity, Urine 1.003 (*)    Bacteria, UA RARE (*)    All other components within normal limits  LACTIC ACID, PLASMA - Abnormal; Notable for the following components:   Lactic Acid, Venous 3.3 (*)    All other components within normal limits  CBG MONITORING, ED - Abnormal; Notable for the following components:   Glucose-Capillary 160 (*)    All other components within normal limits  SARS CORONAVIRUS 2 (HOSPITAL ORDER, [place] [facility])  CULTURE, BLOOD (ROUTINE X 2)  CULTURE, BLOOD (ROUTINE X 2)  URINE CULTURE  LACTIC ACID, PLASMA  LACTIC ACID, PLASMA  TROPONIN I (HIGH SENSITIVITY)  TROPONIN I (HIGH SENSITIVITY)    EKG EKG Interpretation  Date/Time:  Saturday April 22 2019 17:24:10 EDT Ventricular Rate:  110 PR Interval:    QRS Duration: 97 QT Interval:  368 QTC Calculation: 498 R Axis:   78 Text Interpretation:  Sinus tachycardia Nonspecific T abnrm, anterolateral leads Borderline prolonged QT interval Confirmed by Virgel Manifold [phone]) on 04/22/2019 7:46:23 PM   Radiology Ct Head Wo Contrast  Result Date: 04/22/2019 CLINICAL DATA:  Syncopal episode during a bowel movement, became unresponsive, lost consciousness for 10-13 minutes, history dementia, hypertension, prior subdural hematoma EXAM: CT HEAD WITHOUT CONTRAST CT CERVICAL SPINE WITHOUT CONTRAST TECHNIQUE: Multidetector CT imaging of the  head and cervical spine was performed following the standard protocol without intravenous contrast. Multiplanar CT image reconstructions of the cervical spine were also generated. COMPARISON:  CT head 12/25/2016 FINDINGS: CT HEAD FINDINGS Brain: Generalized atrophy. Ex vacuo dilatation of the ventricular system. No midline shift or mass effect. Small vessel chronic ischemic changes of deep cerebral white matter. Old lacunar infarct LEFT thalamus. No intracranial hemorrhage, mass lesion, or evidence of acute infarction. Dural thickening and calcification at the LEFT parietal region likely sequela of prior subdural hemorrhage and craniotomy. No new extra-axial fluid collections. Vascular: Atherosclerotic calcification of internal carotid arteries at skull base. No hyperdense vessels. Skull: Prior LEFT frontoparietal temporal craniotomy. Otherwise intact. Sinuses/Orbits: Clear Other: N/A CT CERVICAL SPINE FINDINGS Alignment: Mild retrolisthesis at C3-C4. Minimal anterolisthesis C7-T1. Skull base and vertebrae: Osseous demineralization. Skull base intact. Multilevel disc space narrowing and endplate spur formation greatest at C3-C4 where accompanying endplate sclerosis is also identified. No fracture, subluxation or bone destruction. Multilevel facet degenerative changes. Soft tissues and spinal canal: Prevertebral soft tissues normal thickness. Remaining cervical soft tissues unremarkable. Disc levels:  No specific abnormalities otherwise seen Upper chest: Lung apices clear Other: N/A IMPRESSION: Atrophy with extensive small vessel chronic  ischemic changes of deep cerebral white matter. No acute intracranial abnormalities. Multilevel degenerative disc and facet disease changes of the cervical spine. No acute cervical spine abnormalities. Electronically Signed   By: Lavonia Dana M.D.   On: 04/22/2019 19:02   Ct Cervical Spine Wo Contrast  Result Date: 04/22/2019 CLINICAL DATA:  Syncopal episode during a bowel movement, became unresponsive, lost consciousness for 10-13 minutes, history dementia, hypertension, prior subdural hematoma EXAM: CT HEAD WITHOUT CONTRAST CT CERVICAL SPINE WITHOUT CONTRAST TECHNIQUE: Multidetector CT imaging of the head and cervical spine was performed following the standard protocol without intravenous contrast. Multiplanar CT image reconstructions of the cervical spine were also generated. COMPARISON:  CT head 12/25/2016 FINDINGS: CT HEAD FINDINGS Brain: Generalized atrophy. Ex vacuo dilatation of the ventricular system. No midline shift or mass effect. Small vessel chronic ischemic changes of deep cerebral white matter. Old lacunar infarct LEFT thalamus. No intracranial hemorrhage, mass lesion, or evidence of acute infarction. Dural thickening and calcification at the LEFT parietal region likely sequela of prior subdural hemorrhage and craniotomy. No new extra-axial fluid collections. Vascular: Atherosclerotic calcification of internal carotid arteries at skull base. No hyperdense vessels. Skull: Prior LEFT frontoparietal temporal craniotomy. Otherwise intact. Sinuses/Orbits: Clear Other: N/A CT CERVICAL SPINE FINDINGS Alignment: Mild retrolisthesis at C3-C4. Minimal anterolisthesis C7-T1. Skull base and vertebrae: Osseous demineralization. Skull base intact. Multilevel disc space narrowing and endplate spur formation greatest at C3-C4 where accompanying endplate sclerosis is also identified. No fracture, subluxation or bone destruction. Multilevel facet degenerative changes. Soft tissues and spinal canal: Prevertebral  soft tissues normal thickness. Remaining cervical soft tissues unremarkable. Disc levels:  No specific abnormalities otherwise seen Upper chest: Lung apices clear Other: N/A IMPRESSION: Atrophy with extensive small vessel chronic ischemic changes of deep cerebral white matter. No acute intracranial abnormalities. Multilevel degenerative disc and facet disease changes of the cervical spine. No acute cervical spine abnormalities. Electronically Signed   By: Lavonia Dana M.D.   On: 04/22/2019 19:02   Dg Chest Port 1 View  Result Date: 04/22/2019 CLINICAL DATA:  Syncope x2 today. Hx of dementia. Pt's arm are partially contracted to the body and had to be held out of the lung field by xray staff. EXAM: PORTABLE CHEST 1 VIEW COMPARISON:  12/25/2016 FINDINGS: Cardiomediastinal silhouette is  normal. Patient is rotated towards the LEFT. The lungs are free of focal consolidations and pleural effusions. No pulmonary edema. Skin folds overlie the chest. Moderate midthoracic spondylosis. IMPRESSION: No evidence for acute cardiopulmonary abnormality. Electronically Signed   By: Nolon Nations M.D.   On: 04/22/2019 18:20    Procedures .Critical Care Performed by: Delia Heady, PA-C Authorized by: Delia Heady, PA-C   Critical care provider statement:    Critical care time (minutes):  35   Critical care time was exclusive of:  Separately billable procedures and treating other patients   Critical care was necessary to treat or prevent imminent or life-threatening deterioration of the following conditions:  Cardiac failure, circulatory failure and shock   Critical care was time spent personally by me on the following activities:  Development of treatment plan with patient or surrogate, discussions with consultants, ordering and performing treatments and interventions, ordering and review of laboratory studies, ordering and review of radiographic studies, re-evaluation of patient's condition, examination of patient  and obtaining history from patient or surrogate   I assumed direction of critical care for this patient from another provider in my specialty: no     (including critical care time)  Medications Ordered in ED Medications  sodium chloride 0.9 % bolus 1,000 mL (0 mLs Intravenous Stopped 04/22/19 2122)  sodium chloride 0.9 % bolus 1,000 mL (1,000 mLs Intravenous New Bag/Given 04/22/19 2123)     Initial Impression / Assessment and Plan / ED Course  I have reviewed the triage vital signs and the nursing notes.  Pertinent labs & imaging results that were available during my care of the patient were reviewed by me and considered in my medical decision making (see chart for details).        85 year old female presents to ED for loss of consciousness.  23 of history is provided by son on the phone and at bedside.  States that patient was on the toilet having a bowel movement when he heard her fall to the ground.  She was unconscious for about 10 minutes. She has a history of dementia at baseline did not feel that her mental status has changed from baseline.  Other than her loss of consciousness, no other prodromal factors that they know of such as complaining of chest pain, vomiting, diarrhea, other injuries or falls.  On my exam patient is pleasantly demented.  She is unwilling to participate in neurological exam.  From what I can tell, no facial asymmetry noted.  She is using both arms to move [place] away from helping her.  Patient hypothermic to 96.3, bear hugger applied.  Tachycardic to 110.  Blood pressure has improved with IV fluids.  Lab work significant for lactic acid of 3.3, hemoglobin of 8.5 which is at her baseline.  Urinalysis is unremarkable, sent for culture.  Chest x-ray, CT of the head, CT of the cervical spine is unremarkable.  Patient given 2 L of IV fluids but continues to be tachycardic.  Patient will need admission for syncope work-up as she remains tachycardic, difficult to obtain  history from.  Final Clinical Impressions(s) / ED Diagnoses   Final diagnoses:  Syncope, unspecified syncope type    ED Discharge Orders    None       Delia Heady, PA-C 04/22/19 2226    Virgel Manifold, MD 04/23/19 2352

## 2019-04-22 NOTE — H&P (Addendum)
History and Physical    Brittany Christian KCL:[id number] DOB: 28-Oct-1930 DOA: 04/22/2019  Referring MD/NP/PA:   PCP: Madelyn Brunner, MD   Patient coming from:  The patient is coming from home.  At baseline, pt is independent for most of ADL.        Chief Complaint: syncope  HPI: Brittany Christian is a 85 y.o. female with medical history significant of hypertension, hyperlipidemia, SDH, dementia, anemia, breast cancer (right mastectomy, chemotherapy, no radiation therapy), who presents with syncope.  Patient son states that he helped the patient get onto the toilet.  He left the bathroom, and then heard her fall onto the floor.  States that pt was unconscious for about 10- 13 minutes.  Per his son, patient moves all extremities.  No facial droop or slurred speech.  At her normal baseline, patient is not oriented x3.  Her mental status seems to be close to the baseline per her son.  Per her son, patient does not seem to have chest pain, shortness of breath, cough, fever or chills.  No nausea, vomiting, diarrhea or abdominal pain.  Did not complain of symptoms of UTI.  Per report, patient was found to have oxygen desaturation to 89% on room air, which improved to 97% on 2 L nasal cannula oxygen.  Blood pressure was low 60/30, which improved to 100/70 after giving 1.5 L normal saline by EMS.  ED Course: pt was found to have negative COVID-19 test, WBC 5.6, negative urinalysis, high sensitive troponin 12, lactic acid 3.3, renal function normal, hypothermia with temperature 96.3, currently blood pressure is 161/69, tachycardia, tachypnea, oxygen saturation 96% on 2 L nasal cannula oxygen.  Chest x-ray negative.  CT head is negative.  CT of C-spine negative for acute bony fracture.  Patient is placed on stepdown bed for observation.  Review of Systems: Could not be reviewed due to dementia.  Allergy: No Known Allergies  Past Medical History:  Diagnosis Date   Cancer ([place])    Hypertension      Past Surgical History:  Procedure Laterality Date   ABDOMINAL HYSTERECTOMY     CRANIOTOMY Left 12/24/2016   Procedure: CRANIOTOMY HEMATOMA EVACUATION SUBDURAL;  Surgeon: Kevan Ny Ditty, MD;  Location: [facility];  Service: Neurosurgery;  Laterality: Left;   MASTECTOMY      Social History:  reports that she has never smoked. She has never used smokeless tobacco. She reports that she does not drink alcohol or use drugs.  Family History:  Family History  Problem Relation Age of Onset   Hypertension Other      Prior to Admission medications   Medication Sig Start Date End Date Taking? Authorizing Provider  feeding supplement, ENSURE ENLIVE, (ENSURE ENLIVE) LIQD Take 237 mLs by mouth 2 (two) times daily between meals. 01/01/17   Marbury Bing, DO  polyethylene glycol (MIRALAX / GLYCOLAX) packet Take 17 g by mouth daily as needed for mild constipation. 01/01/17   Croswell Bing, DO  verapamil (CALAN-SR) 240 MG CR tablet Take 240 mg by mouth daily. 04/21/16   [provider]    Physical Exam: Vitals:   04/22/19 2200 04/22/19 2230 04/22/19 2336 04/22/19 2354  BP: (!) 173/94 (!) 143/74    Pulse:      Resp: 17 (!) 23    Temp:   (!) 96.3 F (35.7 C)   TempSrc:   Rectal   SpO2:      Weight:    49.9 kg  Height:  5\' 5"  (1.651 m)   General: Not in acute distress HEENT:       Eyes: PERRL, EOMI, no scleral icterus.       ENT: No discharge from the ears and nose, no pharynx injection, no tonsillar enlargement.        Neck: No JVD, no bruit, no mass felt. Heme: No neck lymph node enlargement. Cardiac: S1/S2, RRR, No murmurs, No gallops or rubs. Respiratory: No rales, wheezing, rhonchi or rubs. GI: Soft, nondistended, nontender, no organomegaly, BS present. GU: No hematuria Ext: has 1+ pitting leg edema bilaterally. 2+DP/PT pulse bilaterally. Musculoskeletal: No joint deformities, No joint redness or warmth, no limitation of ROM in spin. Skin: No rashes.  Neuro:  alert, but not oriented X3, cranial nerves II-XII grossly intact, moves all extremities. Psych: Patient is not psychotic, no suicidal or hemocidal ideation.  Labs on Admission: I have personally reviewed following labs and imaging studies  CBC: Recent Labs  Lab 04/22/19 1752  WBC 5.6  NEUTROABS 3.1  HGB 8.5*  HCT 28.3*  MCV 87.9  PLT 932   Basic Metabolic Panel: Recent Labs  Lab 04/22/19 1752  NA 137  K 3.7  CL 107  CO2 19*  GLUCOSE 192*  BUN 12  CREATININE 0.82  CALCIUM 8.6*   GFR: Estimated Creatinine Clearance: 38.8 mL/min (by C-G formula based on SCr of 0.82 mg/dL). Liver Function Tests: Recent Labs  Lab 04/22/19 1752  AST 28  ALT 17  ALKPHOS 57  BILITOT 0.8  PROT 5.5*  ALBUMIN 2.6*   No results for input(s): LIPASE, AMYLASE in the last 168 hours. No results for input(s): AMMONIA in the last 168 hours. Coagulation Profile: No results for input(s): INR, PROTIME in the last 168 hours. Cardiac Enzymes: No results for input(s): CKTOTAL, CKMB, CKMBINDEX, TROPONINI in the last 168 hours. BNP (last 3 results) No results for input(s): PROBNP in the last 8760 hours. HbA1C: No results for input(s): HGBA1C in the last 72 hours. CBG: Recent Labs  Lab 04/22/19 1733  GLUCAP 160*   Lipid Profile: No results for input(s): CHOL, HDL, LDLCALC, TRIG, CHOLHDL, LDLDIRECT in the last 72 hours. Thyroid Function Tests: No results for input(s): TSH, T4TOTAL, FREET4, T3FREE, THYROIDAB in the last 72 hours. Anemia Panel: No results for input(s): VITAMINB12, FOLATE, FERRITIN, TIBC, IRON, RETICCTPCT in the last 72 hours. Urine analysis:    Component Value Date/Time   COLORURINE COLORLESS (A) 04/22/2019 2134   APPEARANCEUR CLEAR 04/22/2019 2134   LABSPEC 1.003 (L) 04/22/2019 2134   PHURINE 8.0 04/22/2019 2134   GLUCOSEU NEGATIVE 04/22/2019 2134   HGBUR NEGATIVE 04/22/2019 2134   BILIRUBINUR NEGATIVE 04/22/2019 2134   KETONESUR NEGATIVE 04/22/2019 2134   PROTEINUR  NEGATIVE 04/22/2019 2134   NITRITE NEGATIVE 04/22/2019 2134   LEUKOCYTESUR NEGATIVE 04/22/2019 2134   Sepsis Labs: @LABRCNTIP (procalcitonin:4,lacticidven:4) ) Recent Results (from the past 240 hour(s))  SARS Coronavirus 2 (CEPHEID - Performed in [facility] hospital lab), Hosp Order     Status: None   Collection Time: 04/22/19  6:06 PM   Specimen: Nasopharyngeal Swab  Result Value Ref Range Status   SARS Coronavirus 2 NEGATIVE NEGATIVE Final    Comment: (NOTE) If result is NEGATIVE SARS-CoV-2 target nucleic acids are NOT DETECTED. The SARS-CoV-2 RNA is generally detectable in upper and lower  respiratory specimens during the acute phase of infection. The lowest  concentration of SARS-CoV-2 viral copies this assay can detect is 250  copies / mL. A negative result does not preclude SARS-CoV-2  infection  and should not be used as the sole basis for treatment or other  patient management decisions.  A negative result may occur with  improper specimen collection / handling, submission of specimen other  than nasopharyngeal swab, presence of viral mutation(s) within the  areas targeted by this assay, and inadequate number of viral copies  (<250 copies / mL). A negative result must be combined with clinical  observations, patient history, and epidemiological information. If result is POSITIVE SARS-CoV-2 target nucleic acids are DETECTED. The SARS-CoV-2 RNA is generally detectable in upper and lower  respiratory specimens dur ing the acute phase of infection.  Positive  results are indicative of active infection with SARS-CoV-2.  Clinical  correlation with patient history and other diagnostic information is  necessary to determine patient infection status.  Positive results do  not rule out bacterial infection or co-infection with other viruses. If result is PRESUMPTIVE POSTIVE SARS-CoV-2 nucleic acids MAY BE PRESENT.   A presumptive positive result was obtained on the submitted  specimen  and confirmed on repeat testing.  While 2019 novel coronavirus  (SARS-CoV-2) nucleic acids may be present in the submitted sample  additional confirmatory testing may be necessary for epidemiological  and / or clinical management purposes  to differentiate between  SARS-CoV-2 and other Sarbecovirus currently known to infect humans.  If clinically indicated additional testing with an alternate test  methodology [id number]) is advised. The SARS-CoV-2 RNA is generally  detectable in upper and lower respiratory sp ecimens during the acute  phase of infection. The expected result is Negative. Fact Sheet for Patients:  StrictlyIdeas.no Fact Sheet for Healthcare Providers: BankingDealers.co.za This test is not yet approved or cleared by the [place] FDA and has been authorized for detection and/or diagnosis of SARS-CoV-2 by FDA under an Emergency Use Authorization (EUA).  This EUA will remain in effect (meaning this test can be used) for the duration of the COVID-19 declaration under Section 564(b)(1) of the Act, 21 U.S.C. section 360bbb-3(b)(1), unless the authorization is terminated or revoked sooner. Performed at [facility], [place] [address] [place], [place] [address]      Radiological Exams on Admission: Ct Head Wo Contrast  Result Date: 04/22/2019 CLINICAL DATA:  Syncopal episode during a bowel movement, became unresponsive, lost consciousness for 10-13 minutes, history dementia, hypertension, prior subdural hematoma EXAM: CT HEAD WITHOUT CONTRAST CT CERVICAL SPINE WITHOUT CONTRAST TECHNIQUE: Multidetector CT imaging of the head and cervical spine was performed following the standard protocol without intravenous contrast. Multiplanar CT image reconstructions of the cervical spine were also generated. COMPARISON:  CT head 12/25/2016 FINDINGS: CT HEAD FINDINGS Brain: Generalized atrophy. Ex vacuo dilatation of the ventricular  system. No midline shift or mass effect. Small vessel chronic ischemic changes of deep cerebral white matter. Old lacunar infarct LEFT thalamus. No intracranial hemorrhage, mass lesion, or evidence of acute infarction. Dural thickening and calcification at the LEFT parietal region likely sequela of prior subdural hemorrhage and craniotomy. No new extra-axial fluid collections. Vascular: Atherosclerotic calcification of internal carotid arteries at skull base. No hyperdense vessels. Skull: Prior LEFT frontoparietal temporal craniotomy. Otherwise intact. Sinuses/Orbits: Clear Other: N/A CT CERVICAL SPINE FINDINGS Alignment: Mild retrolisthesis at C3-C4. Minimal anterolisthesis C7-T1. Skull base and vertebrae: Osseous demineralization. Skull base intact. Multilevel disc space narrowing and endplate spur formation greatest at C3-C4 where accompanying endplate sclerosis is also identified. No fracture, subluxation or bone destruction. Multilevel facet degenerative changes. Soft tissues and spinal canal: Prevertebral soft tissues normal thickness.  Remaining cervical soft tissues unremarkable. Disc levels:  No specific abnormalities otherwise seen Upper chest: Lung apices clear Other: N/A IMPRESSION: Atrophy with extensive small vessel chronic ischemic changes of deep cerebral white matter. No acute intracranial abnormalities. Multilevel degenerative disc and facet disease changes of the cervical spine. No acute cervical spine abnormalities. Electronically Signed   By: Lavonia Dana M.D.   On: 04/22/2019 19:02   Ct Cervical Spine Wo Contrast  Result Date: 04/22/2019 CLINICAL DATA:  Syncopal episode during a bowel movement, became unresponsive, lost consciousness for 10-13 minutes, history dementia, hypertension, prior subdural hematoma EXAM: CT HEAD WITHOUT CONTRAST CT CERVICAL SPINE WITHOUT CONTRAST TECHNIQUE: Multidetector CT imaging of the head and cervical spine was performed following the standard protocol without  intravenous contrast. Multiplanar CT image reconstructions of the cervical spine were also generated. COMPARISON:  CT head 12/25/2016 FINDINGS: CT HEAD FINDINGS Brain: Generalized atrophy. Ex vacuo dilatation of the ventricular system. No midline shift or mass effect. Small vessel chronic ischemic changes of deep cerebral white matter. Old lacunar infarct LEFT thalamus. No intracranial hemorrhage, mass lesion, or evidence of acute infarction. Dural thickening and calcification at the LEFT parietal region likely sequela of prior subdural hemorrhage and craniotomy. No new extra-axial fluid collections. Vascular: Atherosclerotic calcification of internal carotid arteries at skull base. No hyperdense vessels. Skull: Prior LEFT frontoparietal temporal craniotomy. Otherwise intact. Sinuses/Orbits: Clear Other: N/A CT CERVICAL SPINE FINDINGS Alignment: Mild retrolisthesis at C3-C4. Minimal anterolisthesis C7-T1. Skull base and vertebrae: Osseous demineralization. Skull base intact. Multilevel disc space narrowing and endplate spur formation greatest at C3-C4 where accompanying endplate sclerosis is also identified. No fracture, subluxation or bone destruction. Multilevel facet degenerative changes. Soft tissues and spinal canal: Prevertebral soft tissues normal thickness. Remaining cervical soft tissues unremarkable. Disc levels:  No specific abnormalities otherwise seen Upper chest: Lung apices clear Other: N/A IMPRESSION: Atrophy with extensive small vessel chronic ischemic changes of deep cerebral white matter. No acute intracranial abnormalities. Multilevel degenerative disc and facet disease changes of the cervical spine. No acute cervical spine abnormalities. Electronically Signed   By: Lavonia Dana M.D.   On: 04/22/2019 19:02   Dg Chest Port 1 View  Result Date: 04/22/2019 CLINICAL DATA:  Syncope x2 today. Hx of dementia. Pt's arm are partially contracted to the body and had to be held out of the lung field by  xray staff. EXAM: PORTABLE CHEST 1 VIEW COMPARISON:  12/25/2016 FINDINGS: Cardiomediastinal silhouette is normal. Patient is rotated towards the LEFT. The lungs are free of focal consolidations and pleural effusions. No pulmonary edema. Skin folds overlie the chest. Moderate midthoracic spondylosis. IMPRESSION: No evidence for acute cardiopulmonary abnormality. Electronically Signed   By: Nolon Nations M.D.   On: 04/22/2019 18:20     EKG: Independently reviewed.    Assessment/Plan Principal Problem:   Syncope Active Problems:   Acute respiratory failure with hypoxia (HCC)   Hypotension   Hypothermia   HLD (hyperlipidemia)   Normocytic anemia   Lactic acidosis   HTN (hypertension)   Elevated troponin  Addendum: initial trop 12, the repeated trop 39, indicating possible NSTEMI, also possible PE. Pt had hx of SDH 11/2016. Today his CT-head is negative. -will start IV heparin now - Trend Trop - Repeat EKG in the am  - aspirin, lipitor  - Risk factor stratification: will check FLP and A1C  - f/u 2d echo  Addendum-2: D-dimer>20. CTA showed pulmonary embolism, including a small saddle embolus, as well as a large burden of nonocclusive  emboli scattered throughout multiple lobar, segmental and subsegmental sized branches in the pulmonary arterial tree bilaterally. Pt has already been on IV heparin. BNP 62.4. -will continue IV hepainr -f/u 2D echo -will get LE venous doppler.  Syncope: Etiology is not clear.  Patient had hypotension which is likely the etiology, but the etiology for hypotension is not clear.  Patient has hypothermia, a potential differential diagnosis is adrenal insufficiency.  Patient does not have leukocytosis, no source of infection identified.  Urinalysis negative, chest x-ray negative.  Does not seem to have sepsis. Will need to r/o PE as below. If pt has no PE, will get MRI of brain.  -will place on SDU for obs -IVF: 1.5 L of NS by EMS and 1L of NS in ED.  -2d echo   -Frequent neuro check.  Acute respiratory failure with hypoxia [facility]): Etiology is not clear.  Chest x-ray negative.  COVID-19 negative.  Potential differential diagnosis is PE.  Patient has 1+ leg edema bilaterally, we also need to rule out acute CHF. -Check BNP -Follow-up 2D echo -Stat d-dimer, if d-dimer is positive will get CT angiogram to rule out PE -As needed Xopenex nebulizer -Nasal cannula oxygen to maintain oxygen saturation above 93%  Hypotension: Etiology is not clear.  Potential differential diagnosis is adrenal insufficiency. Does not seem to have sepsis.  Will hold off antibiotics and will get procalcitonin level. Since patient has acute respiratory failure and persistent tachycardia, PE needs to be ruled out.  Blood pressure responded to IV fluid.  Currently blood pressure is 161/69. -will check cortisol level and then give 50 mg of Solu-Cortef -IVF as above -f/u blood culture, urine culture -Follow-up procalcitonin level, if significantly elevated, will start broad antibiotics empirically.  Lactic acidosis: Lactic acid 3.3, likely due to hypotension and hypoperfusion -will get Procalcitonin and trend lactic acid levels -IVF: as abaove -f/u Bx and Ux  Hypothermia:  -Bair hugger  HLD (hyperlipidemia): -Lipitor  HTN: -hold Bp med due to hypotension  Normocytic anemia: Hemoglobin stable.  Baseline hemoglobin 8- 9.  Her hemoglobin is 8.5 today. -Follow-up by CBC      DVT ppx: on IV Heparin Code Status: DNR per his son Family Communication: Yes, patient's son at bed side Disposition Plan:  Anticipate discharge back to previous home environment Consults called:  none Admission status:  SDU/obs      Date of Service 04/22/2019    [facility]   If 7PM-7AM, please contact night-coverage [email or web address] Password [facility] 04/22/2019, 11:59 PM

## 2019-04-23 ENCOUNTER — Observation Stay (HOSPITAL_COMMUNITY): Payer: Medicare HMO

## 2019-04-23 DIAGNOSIS — I10 Essential (primary) hypertension: Secondary | ICD-10-CM | POA: Diagnosis present

## 2019-04-23 DIAGNOSIS — I82451 Acute embolism and thrombosis of right peroneal vein: Secondary | ICD-10-CM | POA: Diagnosis present

## 2019-04-23 DIAGNOSIS — I340 Nonrheumatic mitral (valve) insufficiency: Secondary | ICD-10-CM

## 2019-04-23 DIAGNOSIS — I361 Nonrheumatic tricuspid (valve) insufficiency: Secondary | ICD-10-CM | POA: Diagnosis not present

## 2019-04-23 DIAGNOSIS — Z79899 Other long term (current) drug therapy: Secondary | ICD-10-CM | POA: Diagnosis not present

## 2019-04-23 DIAGNOSIS — Z66 Do not resuscitate: Secondary | ICD-10-CM | POA: Diagnosis present

## 2019-04-23 DIAGNOSIS — Z9221 Personal history of antineoplastic chemotherapy: Secondary | ICD-10-CM | POA: Diagnosis not present

## 2019-04-23 DIAGNOSIS — Z20828 Contact with and (suspected) exposure to other viral communicable diseases: Secondary | ICD-10-CM | POA: Diagnosis present

## 2019-04-23 DIAGNOSIS — I2699 Other pulmonary embolism without acute cor pulmonale: Secondary | ICD-10-CM | POA: Diagnosis present

## 2019-04-23 DIAGNOSIS — Z90710 Acquired absence of both cervix and uterus: Secondary | ICD-10-CM | POA: Diagnosis not present

## 2019-04-23 DIAGNOSIS — G9341 Metabolic encephalopathy: Secondary | ICD-10-CM | POA: Diagnosis present

## 2019-04-23 DIAGNOSIS — I2692 Saddle embolus of pulmonary artery without acute cor pulmonale: Secondary | ICD-10-CM | POA: Diagnosis present

## 2019-04-23 DIAGNOSIS — I959 Hypotension, unspecified: Secondary | ICD-10-CM | POA: Diagnosis present

## 2019-04-23 DIAGNOSIS — I2694 Multiple subsegmental pulmonary emboli without acute cor pulmonale: Secondary | ICD-10-CM | POA: Diagnosis present

## 2019-04-23 DIAGNOSIS — Z87820 Personal history of traumatic brain injury: Secondary | ICD-10-CM | POA: Diagnosis not present

## 2019-04-23 DIAGNOSIS — I2602 Saddle embolus of pulmonary artery with acute cor pulmonale: Secondary | ICD-10-CM

## 2019-04-23 DIAGNOSIS — R9431 Abnormal electrocardiogram [ECG] [EKG]: Secondary | ICD-10-CM | POA: Diagnosis present

## 2019-04-23 DIAGNOSIS — Z853 Personal history of malignant neoplasm of breast: Secondary | ICD-10-CM | POA: Diagnosis not present

## 2019-04-23 DIAGNOSIS — R7989 Other specified abnormal findings of blood chemistry: Secondary | ICD-10-CM

## 2019-04-23 DIAGNOSIS — M7989 Other specified soft tissue disorders: Secondary | ICD-10-CM

## 2019-04-23 DIAGNOSIS — Z8249 Family history of ischemic heart disease and other diseases of the circulatory system: Secondary | ICD-10-CM | POA: Diagnosis not present

## 2019-04-23 DIAGNOSIS — J9601 Acute respiratory failure with hypoxia: Secondary | ICD-10-CM | POA: Diagnosis present

## 2019-04-23 DIAGNOSIS — R55 Syncope and collapse: Secondary | ICD-10-CM | POA: Diagnosis present

## 2019-04-23 DIAGNOSIS — E785 Hyperlipidemia, unspecified: Secondary | ICD-10-CM | POA: Diagnosis present

## 2019-04-23 DIAGNOSIS — D649 Anemia, unspecified: Secondary | ICD-10-CM | POA: Diagnosis present

## 2019-04-23 DIAGNOSIS — F0390 Unspecified dementia without behavioral disturbance: Secondary | ICD-10-CM | POA: Diagnosis present

## 2019-04-23 DIAGNOSIS — E872 Acidosis: Secondary | ICD-10-CM | POA: Diagnosis present

## 2019-04-23 DIAGNOSIS — R680 Hypothermia, not associated with low environmental temperature: Secondary | ICD-10-CM | POA: Diagnosis present

## 2019-04-23 DIAGNOSIS — E861 Hypovolemia: Secondary | ICD-10-CM | POA: Diagnosis present

## 2019-04-23 DIAGNOSIS — I82462 Acute embolism and thrombosis of left calf muscular vein: Secondary | ICD-10-CM | POA: Diagnosis present

## 2019-04-23 LAB — HEPARIN LEVEL (UNFRACTIONATED)
Heparin Unfractionated: 0.18 IU/mL — ABNORMAL LOW (ref 0.30–0.70)
Heparin Unfractionated: 0.77 IU/mL — ABNORMAL HIGH (ref 0.30–0.70)

## 2019-04-23 LAB — BLOOD CULTURE ID PANEL (REFLEXED)

## 2019-04-23 LAB — LIPID PANEL
Cholesterol: 151 mg/dL (ref 0–200)
HDL: 77 mg/dL (ref >40)
LDL Cholesterol: 68 mg/dL (ref 0–99)
Total CHOL/HDL Ratio: 2 RATIO
Triglycerides: 29 mg/dL (ref <150)
VLDL: 6 mg/dL (ref 0–40)

## 2019-04-23 LAB — CBC
HCT: 27.4 % — ABNORMAL LOW (ref 36.0–46.0)
Hemoglobin: 8.4 g/dL — ABNORMAL LOW (ref 12.0–15.0)
MCH: 27.1 pg (ref 26.0–34.0)
MCHC: 30.7 g/dL (ref 30.0–36.0)
MCV: 88.4 fL (ref 80.0–100.0)
Platelets: 189 10*3/uL (ref 150–400)
RBC: 3.1 MIL/uL — ABNORMAL LOW (ref 3.87–5.11)
RDW: 13.6 % (ref 11.5–15.5)
WBC: 7 10*3/uL (ref 4.0–10.5)
nRBC: 0 % (ref 0.0–0.2)

## 2019-04-23 LAB — HEMOGLOBIN A1C
Hgb A1c MFr Bld: 6 % — ABNORMAL HIGH (ref 4.8–5.6)
Mean Plasma Glucose: 125.5 mg/dL

## 2019-04-23 LAB — PROTIME-INR
INR: 1.2 (ref 0.8–1.2)
Prothrombin Time: 15.3 seconds — ABNORMAL HIGH (ref 11.4–15.2)

## 2019-04-23 LAB — D-DIMER, QUANTITATIVE: D-Dimer, Quant: >20 ug/mL-FEU — ABNORMAL HIGH (ref 0.00–0.50)

## 2019-04-23 LAB — TROPONIN I (HIGH SENSITIVITY)
Troponin I (High Sensitivity): 59 ng/L — ABNORMAL HIGH (ref <18)
Troponin I (High Sensitivity): 67 ng/L — ABNORMAL HIGH (ref <18)
Troponin I (High Sensitivity): 69 ng/L — ABNORMAL HIGH (ref <18)

## 2019-04-23 LAB — ECHOCARDIOGRAM COMPLETE
Height: 65 in
Weight: 1760 oz

## 2019-04-23 LAB — APTT: aPTT: 73 seconds — ABNORMAL HIGH (ref 24–36)

## 2019-04-23 LAB — CBG MONITORING, ED: Glucose-Capillary: 120 mg/dL — ABNORMAL HIGH (ref 70–99)

## 2019-04-23 MED ORDER — HEPARIN (PORCINE) 25000 UT/250ML-% IV SOLN
750.0000 [IU]/h | INTRAVENOUS | Status: DC
  Administered 2019-04-23: 750 [IU]/h via INTRAVENOUS
  Filled 2019-04-23 (×2): qty 250

## 2019-04-23 MED ORDER — LEVALBUTEROL HCL 1.25 MG/0.5ML IN NEBU
1.2500 mg | INHALATION_SOLUTION | Freq: Four times a day (QID) | RESPIRATORY_TRACT | Status: DC
  Administered 2019-04-23: 1.25 mg via RESPIRATORY_TRACT
  Filled 2019-04-23: qty 0.5

## 2019-04-23 MED ORDER — DEXTROSE-NACL 5-0.9 % IV SOLN
INTRAVENOUS | Status: DC
  Administered 2019-04-23 – 2019-04-24 (×2): via INTRAVENOUS

## 2019-04-23 MED ORDER — HEPARIN (PORCINE) 25000 UT/250ML-% IV SOLN
800.0000 [IU]/h | INTRAVENOUS | Status: AC
  Administered 2019-04-23: 900 [IU]/h via INTRAVENOUS
  Administered 2019-04-24: 800 [IU]/h via INTRAVENOUS
  Filled 2019-04-23: qty 250

## 2019-04-23 MED ORDER — LEVALBUTEROL HCL 1.25 MG/0.5ML IN NEBU: 1.2500 mg | INHALATION_SOLUTION | Freq: Four times a day (QID) | RESPIRATORY_TRACT | Status: DC | PRN

## 2019-04-23 MED ORDER — HEPARIN BOLUS VIA INFUSION
2500.0000 [IU] | Freq: Once | INTRAVENOUS | Status: AC
  Administered 2019-04-23: 2500 [IU] via INTRAVENOUS
  Filled 2019-04-23: qty 2500

## 2019-04-23 MED ORDER — IOHEXOL 350 MG/ML SOLN
100.0000 mL | Freq: Once | INTRAVENOUS | Status: AC | PRN
  Administered 2019-04-23: 100 mL via INTRAVENOUS

## 2019-04-23 MED ORDER — HEPARIN BOLUS VIA INFUSION
2000.0000 [IU] | Freq: Once | INTRAVENOUS | Status: AC
  Administered 2019-04-23: 2000 [IU] via INTRAVENOUS
  Filled 2019-04-23: qty 2000

## 2019-04-23 NOTE — ED Notes (Signed)
Pt continuously removing pulse ox, becoming aggressive, and shutting hand. Disposable pulse ox finally applied

## 2019-04-23 NOTE — Progress Notes (Signed)
ANTICOAGULATION CONSULT NOTE - Initial Consult  Pharmacy Consult for heparin Indication: PE vs ACS  No Known Allergies  Patient Measurements: Height: 5\' 5"  (165.1 cm)(per son, pt has advanced dementia) Weight: 110 lb (49.9 kg)(per son) IBW/kg (Calculated) : 57 Heparin Dosing Weight: 49kg  Vital Signs: Temp: 98.7 F (37.1 C) (07/26 0907) Temp Source: Axillary (07/26 0907) BP: 145/81 (07/26 0907) Pulse Rate: 103 (07/26 0907)  Labs: Recent Labs    04/22/19 1752  04/23/19 0237 04/23/19 0341 04/23/19 1010  HGB 8.5*  --   --  8.4*  --   HCT 28.3*  --   --  27.4*  --   PLT 172  --   --  189  --   APTT  --   --  73*  --   --   LABPROT  --   --  15.3*  --   --   INR  --   --  1.2  --   --   HEPARINUNFRC  --   --   --   --  0.18*  CREATININE 0.82  --   --   --   --   TROPONINIHS  --    < > 59* 67* 69*   < > = values in this interval not displayed.    Estimated Creatinine Clearance: 38.8 mL/min (by C-G formula based on SCr of 0.82 mg/dL).   Medical History: Past Medical History:  Diagnosis Date  . Cancer ([place])   . Hypertension     Assessment: 75 yoF admitted with syncope. Pharmacy asked to start pt on heparin drip given rising Hs-troponins and concern for potential PE vs ACS. D-dimer positive, CT chest positive for bilateral PE. Note pt has hx of SDH 11/2016, however current CT head negative. No OAC noted PTA.  Heparin level (0.18) subtherapeutic on drip rate 750 units/hr with a lower bolus of 2000 units given recent fall and hx SDH. CBC stable. No bleeding noted.  Goal of Therapy:  Heparin level 0.3-0.7 units/ml Monitor platelets by anticoagulation protocol: Yes   Plan: -Re-bolus heparin 2500 units x1 -Increase IV heparin to 900 units/hr -Check 6-hr heparin level -Daily heparin level, CBC while on heparin  Richardine Service, PharmD PGY1 Pharmacy Resident Phone: [phone] 04/23/2019  11:45 AM  Please check [email or web address] for unit-specific pharmacy phone numbers.

## 2019-04-23 NOTE — Progress Notes (Signed)
Attempted assessment. Pt very agitated, disoriented x4. Does not follow commands or calm to verbal reassurance. Will re-attempt assessment when son returns.

## 2019-04-23 NOTE — ED Notes (Signed)
SDU  Breakfast ordered  

## 2019-04-23 NOTE — ED Notes (Signed)
Informed MD of increasing trop

## 2019-04-23 NOTE — Progress Notes (Signed)
VASCULAR LAB PRELIMINARY  PRELIMINARY  PRELIMINARY  PRELIMINARY  Bilateral lower extremity venous duplex completed.    Preliminary report:  See CV proc for preliminary results.   Shanigua Gibb, RVT 04/23/2019, 11:40 AM

## 2019-04-23 NOTE — Progress Notes (Signed)
[facility]  In 1/4  mec A detected  [facility] - BLOOD CULTURE IDENTIFICATION (BCID)  Brittany Christian is an 85 y.o. female who presented to [facility] on 04/22/2019 with a chief complaint of syncope, found to have a PE  Assessment: 53 YOF being treated for a PE. Now with 1 of 4 blood cultures showing [facility] with BCID detecting meth R stap epi - likely representative of a contaminate.   Name of physician (or Provider) Contacted: Arrien  Current antibiotics: none  Changes to prescribed antibiotics recommended: None - likely representative of a contaminant  Results for orders placed or performed during the hospital encounter of 04/22/19  Blood Culture ID Panel (Reflexed) (Collected: 04/22/2019  6:11 PM)  Result Value Ref Range   Enterococcus species NOT DETECTED NOT DETECTED   Listeria monocytogenes NOT DETECTED NOT DETECTED   Staphylococcus species DETECTED (A) NOT DETECTED   Staphylococcus aureus (BCID) NOT DETECTED NOT DETECTED   Methicillin resistance DETECTED (A) NOT DETECTED   Streptococcus species NOT DETECTED NOT DETECTED   Streptococcus agalactiae NOT DETECTED NOT DETECTED   Streptococcus pneumoniae NOT DETECTED NOT DETECTED   Streptococcus pyogenes NOT DETECTED NOT DETECTED   Acinetobacter baumannii NOT DETECTED NOT DETECTED   Enterobacteriaceae species NOT DETECTED NOT DETECTED   Enterobacter cloacae complex NOT DETECTED NOT DETECTED   Escherichia coli NOT DETECTED NOT DETECTED   Klebsiella oxytoca NOT DETECTED NOT DETECTED   Klebsiella pneumoniae NOT DETECTED NOT DETECTED   Proteus species NOT DETECTED NOT DETECTED   Serratia marcescens NOT DETECTED NOT DETECTED   Haemophilus influenzae NOT DETECTED NOT DETECTED   Neisseria meningitidis NOT DETECTED NOT DETECTED   Pseudomonas aeruginosa NOT DETECTED NOT DETECTED   Candida albicans NOT DETECTED NOT DETECTED   Candida glabrata NOT DETECTED NOT DETECTED   Candida krusei NOT DETECTED NOT  DETECTED   Candida parapsilosis NOT DETECTED NOT DETECTED   Candida tropicalis NOT DETECTED NOT DETECTED    Lawson Radar 04/23/2019  3:06 PM

## 2019-04-23 NOTE — Progress Notes (Signed)
  Echocardiogram 2D Echocardiogram has been performed.  Brittany Christian G Brittany Christian 04/23/2019, 1:50 PM

## 2019-04-23 NOTE — ED Notes (Signed)
ED TO INPATIENT HANDOFF REPORT  ED Nurse Name and Phone #: Annie Main [phone]  S Name/Age/Gender Rowe Robert 85 y.o. female Room/Bed: 041C/041C  Code Status   Code Status: DNR  Home/SNF/Other Home Disoriented Is this baseline? Yes   Triage Complete: Triage complete  Chief Complaint SYNCOPE AND AMS  Triage Note Pt arrives with [facility] from home; family called EMS after helping pt to restroom; pt lost consciousness while having a bowel movement and became unresponsive. Family states pt did not hit head as they held her up. EMS reports pt lost consciousness for approx 10-13 mins. Pt has hx of dementia. GCS on EMS arrival was 3 and BP was 62/30. 1500 ml fluids given by EMS; BP increased to 100/70. Pt was 89% on RA; placed on 2L O2 by EMS, O2 sat 97%.   Allergies No Known Allergies  Level of Care/Admitting Diagnosis ED Disposition    ED Disposition Condition Comment   Admit  Hospital Area: [facility] [[id number]]  Level of Care: Progressive [102]  I expect the patient will be discharged within 24 hours: No (not a candidate for 5C-Observation unit)  Covid Evaluation: N/A  Diagnosis: Syncope [206001]  Admitting Physician: Ivor Costa [[id number]]  Attending Physician: Ivor Costa [[id number]]  PT Class (Do Not Modify): Observation [104]  PT Acc Code (Do Not Modify): Observation [10022]       B Medical/Surgery History Past Medical History:  Diagnosis Date  . Cancer ([place])   . Hypertension    Past Surgical History:  Procedure Laterality Date  . ABDOMINAL HYSTERECTOMY    . CRANIOTOMY Left 12/24/2016   Procedure: CRANIOTOMY HEMATOMA EVACUATION SUBDURAL;  Surgeon: Kevan Ny Ditty, MD;  Location: [facility];  Service: Neurosurgery;  Laterality: Left;  Marland Kitchen MASTECTOMY       A IV Location/Drains/Wounds Patient Lines/Drains/Airways Status   Active Line/Drains/Airways    Name:   Placement date:   Placement time:   Site:   Days:   Peripheral IV 04/22/19 Right Antecubital    04/22/19    1659    Antecubital   1   Peripheral IV 04/22/19 Left Hand   04/22/19    1659    Hand   1   Incision (Closed) 12/24/16 Head   12/24/16    1136     850          Intake/Output Last 24 hours  Intake/Output Summary (Last 24 hours) at 04/23/2019 0002 Last data filed at 04/22/2019 2122 Gross per 24 hour  Intake 1000 ml  Output -  Net 1000 ml    Labs/Imaging Results for orders placed or performed during the hospital encounter of 04/22/19 (from the past 48 hour(s))  CBG monitoring, ED     Status: Abnormal   Collection Time: 04/22/19  5:33 PM  Result Value Ref Range   Glucose-Capillary 160 (H) 70 - 99 mg/dL   Comment 1 Notify RN    Comment 2 Document in Chart   Comprehensive metabolic panel     Status: Abnormal   Collection Time: 04/22/19  5:52 PM  Result Value Ref Range   Sodium 137 135 - 145 mmol/L   Potassium 3.7 3.5 - 5.1 mmol/L   Chloride 107 98 - 111 mmol/L   CO2 19 (L) 22 - 32 mmol/L   Glucose, Bld 192 (H) 70 - 99 mg/dL   BUN 12 8 - 23 mg/dL   Creatinine, Ser 0.82 0.44 - 1.00 mg/dL   Calcium 8.6 (L) 8.9 -  10.3 mg/dL   Total Protein 5.5 (L) 6.5 - 8.1 g/dL   Albumin 2.6 (L) 3.5 - 5.0 g/dL   AST 28 15 - 41 U/L   ALT 17 0 - 44 U/L   Alkaline Phosphatase 57 38 - 126 U/L   Total Bilirubin 0.8 0.3 - 1.2 mg/dL   GFR calc non Af Amer >60 >60 mL/min   GFR calc Af Amer >60 >60 mL/min   Anion gap 11 5 - 15    Comment: Performed at [facility] [address], [place], [place] [address]  CBC WITH DIFFERENTIAL     Status: Abnormal   Collection Time: 04/22/19  5:52 PM  Result Value Ref Range   WBC 5.6 4.0 - 10.5 K/uL   RBC 3.22 (L) 3.87 - 5.11 MIL/uL   Hemoglobin 8.5 (L) 12.0 - 15.0 g/dL   HCT 28.3 (L) 36.0 - 46.0 %   MCV 87.9 80.0 - 100.0 fL   MCH 26.4 26.0 - 34.0 pg   MCHC 30.0 30.0 - 36.0 g/dL   RDW 13.4 11.5 - 15.5 %   Platelets 172 150 - 400 K/uL   nRBC 0.0 0.0 - 0.2 %   Neutrophils Relative % 54 %   Neutro Abs 3.1 1.7 - 7.7 K/uL   Lymphocytes  Relative 30 %   Lymphs Abs 1.7 0.7 - 4.0 K/uL   Monocytes Relative 11 %   Monocytes Absolute 0.6 0.1 - 1.0 K/uL   Eosinophils Relative 3 %   Eosinophils Absolute 0.2 0.0 - 0.5 K/uL   Basophils Relative 1 %   Basophils Absolute 0.0 0.0 - 0.1 K/uL   Immature Granulocytes 1 %   Abs Immature Granulocytes 0.03 0.00 - 0.07 K/uL    Comment: Performed at [facility] [address] [place], [place] [address]  SARS Coronavirus 2 (CEPHEID - Performed in [place] hospital lab), Hosp Order     Status: None   Collection Time: 04/22/19  6:06 PM   Specimen: Nasopharyngeal Swab  Result Value Ref Range   SARS Coronavirus 2 NEGATIVE NEGATIVE    Comment: (NOTE) If result is NEGATIVE SARS-CoV-2 target nucleic acids are NOT DETECTED. The SARS-CoV-2 RNA is generally detectable in upper and lower  respiratory specimens during the acute phase of infection. The lowest  concentration of SARS-CoV-2 viral copies this assay can detect is 250  copies / mL. A negative result does not preclude SARS-CoV-2 infection  and should not be used as the sole basis for treatment or other  patient management decisions.  A negative result may occur with  improper specimen collection / handling, submission of specimen other  than nasopharyngeal swab, presence of viral mutation(s) within the  areas targeted by this assay, and inadequate number of viral copies  (<250 copies / mL). A negative result must be combined with clinical  observations, patient history, and epidemiological information. If result is POSITIVE SARS-CoV-2 target nucleic acids are DETECTED. The SARS-CoV-2 RNA is generally detectable in upper and lower  respiratory specimens dur ing the acute phase of infection.  Positive  results are indicative of active infection with SARS-CoV-2.  Clinical  correlation with patient history and other diagnostic information is  necessary to determine patient infection status.  Positive results do  not rule out  bacterial infection or co-infection with other viruses. If result is PRESUMPTIVE POSTIVE SARS-CoV-2 nucleic acids MAY BE PRESENT.   A presumptive positive result was obtained on the submitted specimen  and confirmed  on repeat testing.  While 2019 novel coronavirus  (SARS-CoV-2) nucleic acids may be present in the submitted sample  additional confirmatory testing may be necessary for epidemiological  and / or clinical management purposes  to differentiate between  SARS-CoV-2 and other Sarbecovirus currently known to infect humans.  If clinically indicated additional testing with an alternate test  methodology [id number]) is advised. The SARS-CoV-2 RNA is generally  detectable in upper and lower respiratory sp ecimens during the acute  phase of infection. The expected result is Negative. Fact Sheet for Patients:  StrictlyIdeas.no Fact Sheet for Healthcare Providers: BankingDealers.co.za This test is not yet approved or cleared by the [place] FDA and has been authorized for detection and/or diagnosis of SARS-CoV-2 by FDA under an Emergency Use Authorization (EUA).  This EUA will remain in effect (meaning this test can be used) for the duration of the COVID-19 declaration under Section 564(b)(1) of the Act, 21 U.S.C. section 360bbb-3(b)(1), unless the authorization is terminated or revoked sooner. Performed at [facility], [place] [address] [place], [place] [address]   Lactic acid, plasma     Status: Abnormal   Collection Time: 04/22/19  6:17 PM  Result Value Ref Range   Lactic Acid, Venous 3.3 (HH) 0.5 - 1.9 mmol/L    Comment: CRITICAL RESULT CALLED TO, READ BACK BY AND VERIFIED WITH: VILBI, N RN @ 1062 ON 04/22/2019 BY TEMOCHE, H Performed at [facility], [place] [address] [place], [place] [address]   Troponin I (High Sensitivity)     Status: None   Collection Time: 04/22/19  6:40 PM  Result Value Ref Range   Troponin I  (High Sensitivity) 12 <18 ng/L    Comment: (NOTE) Elevated high sensitivity troponin I (hsTnI) values and significant  changes across serial measurements may suggest ACS but many other  chronic and acute conditions are known to elevate hsTnI results.  Refer to the "Links" section for chest pain algorithms and additional  guidance. Performed at [facility], [place] [address], [place], [place] [address]   Urinalysis, Complete w Microscopic     Status: Abnormal   Collection Time: 04/22/19  9:34 PM  Result Value Ref Range   Color, Urine COLORLESS (A) YELLOW   APPearance CLEAR CLEAR   Specific Gravity, Urine 1.003 (L) 1.005 - 1.030   pH 8.0 5.0 - 8.0   Glucose, UA NEGATIVE NEGATIVE mg/dL   Hgb urine dipstick NEGATIVE NEGATIVE   Bilirubin Urine NEGATIVE NEGATIVE   Ketones, ur NEGATIVE NEGATIVE mg/dL   Protein, ur NEGATIVE NEGATIVE mg/dL   Nitrite NEGATIVE NEGATIVE   Leukocytes,Ua NEGATIVE NEGATIVE   RBC / HPF 0-5 0 - 5 RBC/hpf   WBC, UA 0-5 0 - 5 WBC/hpf   Bacteria, UA RARE (A) NONE SEEN   Squamous Epithelial / LPF 0-5 0 - 5    Comment: Performed at [facility], [place] [address], [place], [place] [address]  Troponin I (High Sensitivity)     Status: Abnormal   Collection Time: 04/22/19 10:17 PM  Result Value Ref Range   Troponin I (High Sensitivity) 39 (H) <18 ng/L    Comment: RESULT CALLED TO, READ BACK BY AND VERIFIED WITH: Tanaysha Alkins,S RN 04/22/2019 2336 JORDANS (NOTE) Elevated high sensitivity troponin I (hsTnI) values and significant  changes across serial measurements may suggest ACS but many other  chronic and acute conditions are known to elevate hsTnI results.  Refer to the Links section for chest pain algorithms and  additional  guidance. Performed at [facility], [place] [address] [place], [place] [address]   Lactic acid, plasma     Status: None   Collection Time: 04/22/19 10:17 PM  Result Value Ref Range   Lactic Acid, Venous 1.6 0.5 - 1.9 mmol/L     Comment: Performed at [facility] [address], [place], [place] [address]  Brain natriuretic peptide     Status: None   Collection Time: 04/22/19 10:17 PM  Result Value Ref Range   B Natriuretic Peptide 62.4 0.0 - 100.0 pg/mL    Comment: Performed at [facility] [address] [place], [place] [address]  Procalcitonin     Status: None   Collection Time: 04/22/19 10:17 PM  Result Value Ref Range   Procalcitonin <0.10 ng/mL    Comment:        Interpretation: PCT (Procalcitonin) <= 0.5 ng/mL: Systemic infection (sepsis) is not likely. Local bacterial infection is possible. (NOTE)       Sepsis PCT Algorithm           Lower Respiratory Tract                                      Infection PCT Algorithm    ----------------------------     ----------------------------         PCT < 0.25 ng/mL                PCT < 0.10 ng/mL         Strongly encourage             Strongly discourage   discontinuation of antibiotics    initiation of antibiotics    ----------------------------     -----------------------------       PCT 0.25 - 0.50 ng/mL            PCT 0.10 - 0.25 ng/mL               OR       >80% decrease in PCT            Discourage initiation of                                            antibiotics      Encourage discontinuation           of antibiotics    ----------------------------     -----------------------------         PCT >= 0.50 ng/mL              PCT 0.26 - 0.50 ng/mL               AND        <80% decrease in PCT             Encourage initiation of                                             antibiotics       Encourage continuation           of antibiotics    ----------------------------     -----------------------------  PCT >= 0.50 ng/mL                  PCT > 0.50 ng/mL               AND         increase in PCT                  Strongly encourage                                      initiation of antibiotics    Strongly encourage escalation            of antibiotics                                     -----------------------------                                           PCT <= 0.25 ng/mL                                                 OR                                        > 80% decrease in PCT                                     Discontinue / Do not initiate                                             antibiotics Performed at [facility], [address], [place], [place] [address]   Cortisol-am, blood     Status: None   Collection Time: 04/22/19 10:17 PM  Result Value Ref Range   Cortisol - AM 17.0 6.7 - 22.6 ug/dL    Comment: Performed at [facility], [place] [address] [place],  [address]   Ct Head Wo Contrast  Result Date: 04/22/2019 CLINICAL DATA:  Syncopal episode during a bowel movement, became unresponsive, lost consciousness for 10-13 minutes, history dementia, hypertension, prior subdural hematoma EXAM: CT HEAD WITHOUT CONTRAST CT CERVICAL SPINE WITHOUT CONTRAST TECHNIQUE: Multidetector CT imaging of the head and cervical spine was performed following the standard protocol without intravenous contrast. Multiplanar CT image reconstructions of the cervical spine were also generated. COMPARISON:  CT head 12/25/2016 FINDINGS: CT HEAD FINDINGS Brain: Generalized atrophy. Ex vacuo dilatation of the ventricular system. No midline shift or mass effect. Small vessel chronic ischemic changes of deep cerebral white matter. Old lacunar infarct LEFT thalamus. No intracranial hemorrhage, mass lesion, or evidence of acute infarction. Dural thickening and calcification at the LEFT parietal region likely sequela of prior subdural hemorrhage and craniotomy. No new extra-axial fluid collections. Vascular: Atherosclerotic calcification  of internal carotid arteries at skull base. No hyperdense vessels. Skull: Prior LEFT frontoparietal temporal craniotomy. Otherwise intact. Sinuses/Orbits: Clear Other: N/A CT CERVICAL SPINE  FINDINGS Alignment: Mild retrolisthesis at C3-C4. Minimal anterolisthesis C7-T1. Skull base and vertebrae: Osseous demineralization. Skull base intact. Multilevel disc space narrowing and endplate spur formation greatest at C3-C4 where accompanying endplate sclerosis is also identified. No fracture, subluxation or bone destruction. Multilevel facet degenerative changes. Soft tissues and spinal canal: Prevertebral soft tissues normal thickness. Remaining cervical soft tissues unremarkable. Disc levels:  No specific abnormalities otherwise seen Upper chest: Lung apices clear Other: N/A IMPRESSION: Atrophy with extensive small vessel chronic ischemic changes of deep cerebral white matter. No acute intracranial abnormalities. Multilevel degenerative disc and facet disease changes of the cervical spine. No acute cervical spine abnormalities. Electronically Signed   By: Lavonia Dana M.D.   On: 04/22/2019 19:02   Ct Cervical Spine Wo Contrast  Result Date: 04/22/2019 CLINICAL DATA:  Syncopal episode during a bowel movement, became unresponsive, lost consciousness for 10-13 minutes, history dementia, hypertension, prior subdural hematoma EXAM: CT HEAD WITHOUT CONTRAST CT CERVICAL SPINE WITHOUT CONTRAST TECHNIQUE: Multidetector CT imaging of the head and cervical spine was performed following the standard protocol without intravenous contrast. Multiplanar CT image reconstructions of the cervical spine were also generated. COMPARISON:  CT head 12/25/2016 FINDINGS: CT HEAD FINDINGS Brain: Generalized atrophy. Ex vacuo dilatation of the ventricular system. No midline shift or mass effect. Small vessel chronic ischemic changes of deep cerebral white matter. Old lacunar infarct LEFT thalamus. No intracranial hemorrhage, mass lesion, or evidence of acute infarction. Dural thickening and calcification at the LEFT parietal region likely sequela of prior subdural hemorrhage and craniotomy. No new extra-axial fluid collections.  Vascular: Atherosclerotic calcification of internal carotid arteries at skull base. No hyperdense vessels. Skull: Prior LEFT frontoparietal temporal craniotomy. Otherwise intact. Sinuses/Orbits: Clear Other: N/A CT CERVICAL SPINE FINDINGS Alignment: Mild retrolisthesis at C3-C4. Minimal anterolisthesis C7-T1. Skull base and vertebrae: Osseous demineralization. Skull base intact. Multilevel disc space narrowing and endplate spur formation greatest at C3-C4 where accompanying endplate sclerosis is also identified. No fracture, subluxation or bone destruction. Multilevel facet degenerative changes. Soft tissues and spinal canal: Prevertebral soft tissues normal thickness. Remaining cervical soft tissues unremarkable. Disc levels:  No specific abnormalities otherwise seen Upper chest: Lung apices clear Other: N/A IMPRESSION: Atrophy with extensive small vessel chronic ischemic changes of deep cerebral white matter. No acute intracranial abnormalities. Multilevel degenerative disc and facet disease changes of the cervical spine. No acute cervical spine abnormalities. Electronically Signed   By: Lavonia Dana M.D.   On: 04/22/2019 19:02   Dg Chest Port 1 View  Result Date: 04/22/2019 CLINICAL DATA:  Syncope x2 today. Hx of dementia. Pt's arm are partially contracted to the body and had to be held out of the lung field by xray staff. EXAM: PORTABLE CHEST 1 VIEW COMPARISON:  12/25/2016 FINDINGS: Cardiomediastinal silhouette is normal. Patient is rotated towards the LEFT. The lungs are free of focal consolidations and pleural effusions. No pulmonary edema. Skin folds overlie the chest. Moderate midthoracic spondylosis. IMPRESSION: No evidence for acute cardiopulmonary abnormality. Electronically Signed   By: Nolon Nations M.D.   On: 04/22/2019 18:20    Pending Labs Unresulted Labs (From admission, onward)    Start     Ordered   04/23/19 0500  Hemoglobin A1c  Tomorrow morning,   R     04/22/19 2348   04/23/19  0500  Lipid panel  Tomorrow morning,  R    Comments: Please obtain as a fasting lipid panel - should not have eaten/ drank food for 8 hours prior to labs.    04/22/19 2348   04/22/19 2300  D-dimer, quantitative (not at [facility])  Once,   R     04/22/19 2300   04/22/19 2300  Protime-INR  Once,   R     04/22/19 2300   04/22/19 2300  APTT  Once,   R     04/22/19 2300   04/22/19 2231  Lactic acid, plasma  STAT Now then every 3 hours,   STAT     04/22/19 2230   04/22/19 2144  Lactic acid, plasma  Now then every 2 hours,   STAT     04/22/19 2143   04/22/19 1720  Urine culture  ONCE - STAT,   STAT     04/22/19 1719   04/22/19 1719  Blood Cultures (routine x 2)  BLOOD CULTURE X 2,   STAT     04/22/19 1719          Vitals/Pain Today's Vitals   04/22/19 2200 04/22/19 2230 04/22/19 2336 04/22/19 2354  BP: (!) 173/94 (!) 143/74    Pulse:      Resp: 17 (!) 23    Temp:   (!) 96.3 F (35.7 C)   TempSrc:   Rectal   SpO2:      Weight:    49.9 kg  Height:    5\' 5"  (1.651 m)    Isolation Precautions No active isolations  Medications Medications  levalbuterol (XOPENEX) nebulizer solution 1.25 mg (has no administration in time range)  sodium chloride flush (NS) 0.9 % injection 3 mL (has no administration in time range)  acetaminophen (TYLENOL) tablet 650 mg (has no administration in time range)    Or  acetaminophen (TYLENOL) suppository 650 mg (has no administration in time range)  ondansetron (ZOFRAN) tablet 4 mg (has no administration in time range)    Or  ondansetron (ZOFRAN) injection 4 mg (has no administration in time range)  aspirin EC tablet 325 mg (has no administration in time range)  atorvastatin (LIPITOR) tablet 40 mg (has no administration in time range)  vitamin B-12 (CYANOCOBALAMIN) tablet 1,000 mcg (has no administration in time range)  sodium chloride 0.9 % bolus 1,000 mL (0 mLs Intravenous Stopped 04/22/19 2122)  sodium chloride 0.9 % bolus 1,000 mL (0 mLs Intravenous  Stopped 04/22/19 2251)  hydrocortisone sodium succinate (SOLU-CORTEF) 100 MG injection 50 mg (50 mg Intravenous Given 04/22/19 2302)    Mobility non-ambulatory High fall risk   Focused Assessments Neuro Assessment Handoff:  Swallow screen pass? deferred         Neuro Assessment: Exceptions to WDL Neuro Checks:      Last Documented NIHSS Modified Score:   Has TPA been given? No If patient is a Neuro Trauma and patient is going to OR before floor call report to [place] nurse: [phone] or [phone]     R Recommendations: See Admitting Provider Note  Report given to:   Additional Notes:

## 2019-04-23 NOTE — Progress Notes (Signed)
Very limited assessment- patient staying curled in fetal position. Was able to assess lungs and legs- Will continue to attempt full assessment

## 2019-04-23 NOTE — Progress Notes (Signed)
Patient received from ED, stable, heparin infusing.  Non verbal, resists any manipulation, movement.  Awaiting son's arrival at this time to assist with full assessment.

## 2019-04-23 NOTE — Progress Notes (Signed)
ANTICOAGULATION CONSULT NOTE - Initial Consult  Pharmacy Consult for heparin Indication: PE vs ACS  No Known Allergies  Patient Measurements: Height: 5\' 5"  (165.1 cm)(per son, pt has advanced dementia) Weight: 110 lb (49.9 kg)(per son) IBW/kg (Calculated) : 57 Heparin Dosing Weight: 49kg  Vital Signs: Temp: 98.5 F (36.9 C) (07/26 2047) Temp Source: Axillary (07/26 2047) BP: 153/88 (07/26 2047) Pulse Rate: 96 (07/26 2047)  Labs: Recent Labs    04/22/19 1752  04/23/19 0237 04/23/19 0341 04/23/19 1010 04/23/19 2028  HGB 8.5*  --   --  8.4*  --   --   HCT 28.3*  --   --  27.4*  --   --   PLT 172  --   --  189  --   --   APTT  --   --  73*  --   --   --   LABPROT  --   --  15.3*  --   --   --   INR  --   --  1.2  --   --   --   HEPARINUNFRC  --   --   --   --  0.18* 0.77*  CREATININE 0.82  --   --   --   --   --   TROPONINIHS  --    < > 59* 67* 69*  --    < > = values in this interval not displayed.    Estimated Creatinine Clearance: 38.8 mL/min (by C-G formula based on SCr of 0.82 mg/dL).   Medical History: Past Medical History:  Diagnosis Date  . Cancer ([place])   . Hypertension     Assessment: 6 yoF admitted with syncope. Pharmacy asked to start pt on heparin drip given rising Hs-troponins and concern for potential PE vs ACS. D-dimer positive, CT chest positive for bilateral PE. Note pt has hx of SDH 11/2016, however current CT head negative. No OAC noted PTA.  Heparin level (0.77) supratherapeutic on drip rate of 900 units/hr  CBC stable. No bleeding noted.  Goal of Therapy:  Heparin level 0.3-0.7 units/ml Monitor platelets by anticoagulation protocol: Yes   Plan: -Decrease IV heparin to 800 units/hr -Check 6-hr heparin level with AM Labs -Daily heparin level, CBC while on heparin  Sherren Kerns, PharmD PGY1 [place] Resident [phone] 04/23/2019  10:13 PM  Please check [email or web address] for unit-specific pharmacy phone numbers.

## 2019-04-23 NOTE — Progress Notes (Signed)
PROGRESS NOTE    Brittany Christian  BWL:[id number] DOB: 1931-07-03 DOA: 04/22/2019 PCP: Madelyn Brunner, MD    Brief Narrative:  85 year old female who presented with syncope.  He does have significant past medical history for hypertension, dyslipidemia, dementia, subdural hematoma and breast cancer.  Patient sustained a syncope episode while being in the bathroom.  She was unconscious for about 10 to 13 minutes.  When EMS arrived her oxygen saturation was 89% on room air and blood pressure 60/30.  After 1.5 L of intravenous normal saline and supplemental oxygen (2LPM), blood pressure 143/74, respiratory rate 23, temperature 96.3, oxygen saturation 96%.  Her lungs were clear to auscultation bilaterally, heart S1-S2 present and rhythmic, abdomen was soft, positive bilateral lower extremity edema, patient was awake but disoriented, non focal.  Sodium 137, potassium 3.7, chloride 107, bicarb 19, glucose 192, BUN 12, creatinine 0.82, high sensitive troponin 12.  White count 5.6, hemoglobin 8.5, hematocrit 28.3, platelets 172.  D-dimer more than 20, SARS COVID-19 negative.  Urine analysis negative for infection.  Head CT with atrophy, extensive small vessel chronic ischemic changes, no cervical spine acute changes.  Chest x-ray with increase interstitial markings on the right side, CT chest with faint groundglass opacities in the right, positive for pulmonary embolism including a small saddle embolus, large burden nonocclusive emboli scattered throughout multiple lobar, segmental and subsegmental sized branches in the pulmonary artery tree bilaterally.  EKG 110 bpm, normal axis, normal intervals, sinus rhythm, no ST segment or T wave changes.  Patient was admitted to the hospital working diagnosis of acute hypoxic respiratory failure complicated by syncope due to acute bilateral  pulmonary embolism.   Assessment & Plan:   Principal Problem:   Syncope Active Problems:   Acute respiratory failure with  hypoxia (HCC)   Hypotension   Hypothermia   HLD (hyperlipidemia)   Normocytic anemia   Lactic acidosis   HTN (hypertension)   Elevated troponin   1. Acute hypoxic respiratory failure due to bilateral acute pulmonary embolism, (saddle). Oxygenation has improved, this am 97 to 100 on room air, difficult to assess for dyspnea or chest pain due to dementia. Continue anticoagulation with IV  Heparin, follow on echocardiogram and continue oxymetry and blood pressure monitoring.   2. Acute hypovolemic hypotension. Blood pressure this am 100/57, responded to isotonic saline IV, will continue blood pressure monitoring, continue gentle hydration with saline IV 50 ml per H.  3. Dementia with metabolic encephalopathy. Patient somnolent this am, has underlying advance dementia, continue neuro checks per unit protocol and aspiration precautions, her son is at the bedside.  4. Chronic anemia. Multifactorial, continue cell count monitoring, continue anticoagulation with heparin, no signs of active bleeding.   5. Dyslipidemia. Continue atorvasatatin.    DVT prophylaxis: heparin   Code Status: dnr  Family Communication: I spoke with patient's family at the bedside and all questions were addressed.   Disposition Plan/ discharge barriers: pending clinical improvement.   Body mass index is 18.3 kg/m. Malnutrition Type:      Malnutrition Characteristics:      Nutrition Interventions:     RN Pressure Injury Documentation:     Consultants:     Procedures:     Antimicrobials:       Subjective: Patient with advanced dementia, all information obtained from her son at the bedside. Patient not interactive this am. Apparently at home sedentary and needs assistance for daily life activities like feeding and bathing.   Objective: Vitals:   04/23/19  7416 04/23/19 0630 04/23/19 0800 04/23/19 0907  BP:  (!) 104/57 (!) 102/55 (!) 145/81  Pulse:  93 95 (!) 103  Resp:  13 15   Temp:     98.7 F (37.1 C)  TempSrc:    Axillary  SpO2: 100% 97% 97% 100%  Weight:      Height:        Intake/Output Summary (Last 24 hours) at 04/23/2019 1036 Last data filed at 04/22/2019 2122 Gross per 24 hour  Intake 1000 ml  Output -  Net 1000 ml   Filed Weights   04/22/19 2354  Weight: 49.9 kg    Examination:   General: deconditioned and ill looking appearing.  Neurology: somnolent but non focal, eyes closed and not following commands.  E ENT: no pallor, no icterus, oral mucosa moist Cardiovascular: No JVD. S1-S2 present, rhythmic, no gallops, rubs, or murmurs. Positive bilateral non pitting lower extremity edema. Pulmonary: breath sounds bilaterally, no wheezing, rhonchi or rales. Gastrointestinal. Abdomen with no organomegaly, non tender, no rebound or guarding Skin. No rashes Musculoskeletal: no joint deformities     Data Reviewed: I have personally reviewed following labs and imaging studies  CBC: Recent Labs  Lab 04/22/19 1752 04/23/19 0341  WBC 5.6 7.0  NEUTROABS 3.1  --   HGB 8.5* 8.4*  HCT 28.3* 27.4*  MCV 87.9 88.4  PLT 172 384   Basic Metabolic Panel: Recent Labs  Lab 04/22/19 1752  NA 137  K 3.7  CL 107  CO2 19*  GLUCOSE 192*  BUN 12  CREATININE 0.82  CALCIUM 8.6*   GFR: Estimated Creatinine Clearance: 38.8 mL/min (by C-G formula based on SCr of 0.82 mg/dL). Liver Function Tests: Recent Labs  Lab 04/22/19 1752  AST 28  ALT 17  ALKPHOS 57  BILITOT 0.8  PROT 5.5*  ALBUMIN 2.6*   No results for input(s): LIPASE, AMYLASE in the last 168 hours. No results for input(s): AMMONIA in the last 168 hours. Coagulation Profile: Recent Labs  Lab 04/23/19 0237  INR 1.2   Cardiac Enzymes: No results for input(s): CKTOTAL, CKMB, CKMBINDEX, TROPONINI in the last 168 hours. BNP (last 3 results) No results for input(s): PROBNP in the last 8760 hours. HbA1C: Recent Labs    04/23/19 0341  HGBA1C 6.0*   CBG: Recent Labs  Lab 04/22/19 1733  04/23/19 0554  GLUCAP 160* 120*   Lipid Profile: Recent Labs    04/23/19 0341  CHOL 151  HDL 77  LDLCALC 68  TRIG 29  CHOLHDL 2.0   Thyroid Function Tests: No results for input(s): TSH, T4TOTAL, FREET4, T3FREE, THYROIDAB in the last 72 hours. Anemia Panel: No results for input(s): VITAMINB12, FOLATE, FERRITIN, TIBC, IRON, RETICCTPCT in the last 72 hours.    Radiology Studies: I have reviewed all of the imaging during this hospital visit personally     Scheduled Meds: . aspirin EC  325 mg Oral Daily  . atorvastatin  40 mg Oral q1800  . levalbuterol  1.25 mg Nebulization Q6H  . sodium chloride flush  3 mL Intravenous Q12H  . vitamin B-12  1,000 mcg Oral Daily   Continuous Infusions: . heparin 750 Units/hr (04/23/19 0136)     LOS: 0 days         Gerome Apley, MD

## 2019-04-24 LAB — BASIC METABOLIC PANEL
Anion gap: 11 (ref 5–15)
BUN: 8 mg/dL (ref 8–23)
CO2: 22 mmol/L (ref 22–32)
Calcium: 8.9 mg/dL (ref 8.9–10.3)
Chloride: 108 mmol/L (ref 98–111)
Creatinine, Ser: 0.66 mg/dL (ref 0.44–1.00)
GFR calc Af Amer: >60 mL/min (ref >60)
GFR calc non Af Amer: >60 mL/min (ref >60)
Glucose, Bld: 111 mg/dL — ABNORMAL HIGH (ref 70–99)
Potassium: 3.5 mmol/L (ref 3.5–5.1)
Sodium: 141 mmol/L (ref 135–145)

## 2019-04-24 LAB — CBC
HCT: 24.7 % — ABNORMAL LOW (ref 36.0–46.0)
Hemoglobin: 7.7 g/dL — ABNORMAL LOW (ref 12.0–15.0)
MCH: 27 pg (ref 26.0–34.0)
MCHC: 31.2 g/dL (ref 30.0–36.0)
MCV: 86.7 fL (ref 80.0–100.0)
Platelets: 212 10*3/uL (ref 150–400)
RBC: 2.85 MIL/uL — ABNORMAL LOW (ref 3.87–5.11)
RDW: 13.8 % (ref 11.5–15.5)
WBC: 6.9 10*3/uL (ref 4.0–10.5)
nRBC: 0 % (ref 0.0–0.2)

## 2019-04-24 LAB — HEPARIN LEVEL (UNFRACTIONATED)
Heparin Unfractionated: 0.47 IU/mL (ref 0.30–0.70)
Heparin Unfractionated: 0.43 IU/mL (ref 0.30–0.70)

## 2019-04-24 LAB — URINE CULTURE

## 2019-04-24 LAB — GLUCOSE, CAPILLARY: Glucose-Capillary: 96 mg/dL (ref 70–99)

## 2019-04-24 MED ORDER — RIVAROXABAN 15 MG PO TABS
15.0000 mg | ORAL_TABLET | Freq: Two times a day (BID) | ORAL | Status: DC
  Administered 2019-04-24 – 2019-04-25 (×2): 15 mg via ORAL
  Filled 2019-04-24 (×2): qty 1

## 2019-04-24 MED ORDER — RIVAROXABAN 20 MG PO TABS: 20.0000 mg | ORAL_TABLET | Freq: Every day | ORAL | Status: DC

## 2019-04-24 NOTE — Progress Notes (Addendum)
ANTICOAGULATION CONSULT NOTE - [facility] for Heparin >> Xarelto (see addendum) Indication: PE, r/o ACS  No Known Allergies  Patient Measurements: Height: 5\' 5"  (165.1 cm)(per son, pt has advanced dementia) Weight: 135 lb 12.9 oz (61.6 kg) IBW/kg (Calculated) : 57 Heparin Dosing Weight: 49kg  Vital Signs: Temp: 98.6 F (37 C) (07/27 0700) Temp Source: Oral (07/27 0700) BP: 135/74 (07/27 0700) Pulse Rate: 90 (07/27 0700)  Labs: Recent Labs    04/22/19 1752  04/23/19 0237 04/23/19 0341 04/23/19 1010 04/23/19 2028 04/24/19 0438  HGB 8.5*  --   --  8.4*  --   --  7.7*  HCT 28.3*  --   --  27.4*  --   --  24.7*  PLT 172  --   --  189  --   --  212  APTT  --   --  73*  --   --   --   --   LABPROT  --   --  15.3*  --   --   --   --   INR  --   --  1.2  --   --   --   --   HEPARINUNFRC  --   --   --   --  0.18* 0.77* 0.47  CREATININE 0.82  --   --   --   --   --  0.66  TROPONINIHS  --    < > 59* 67* 69*  --   --    < > = values in this interval not displayed.    Estimated Creatinine Clearance: 45.4 mL/min (by C-G formula based on SCr of 0.66 mg/dL).   Assessment: 8 yoF admitted with syncope. Pharmacy asked to start pt on heparin drip given rising Hs-troponins and concern for potential PE vs ACS. D-dimer positive, CT chest positive for bilateral PE. Note pt has hx of SDH 11/2016, however current CT head negative. No OAC noted PTA.  Heparin level this morning is therapeutic (HL 0.47), a repeat confirmatory level is also therapeutic (HL 0.43). Hgb/Hct slight drop, plts wnl - no bleeding noted at this time. Cont current rate - will follow-up with AM labs.  Goal of Therapy:  Heparin level 0.3-0.7 units/ml Monitor platelets by anticoagulation protocol: Yes   Plan: - Continue Heparin at 800 units/hr (8 ml/hr) - Will continue to monitor for any signs/symptoms of bleeding and will follow up with heparin level in the a.m.   Thank you for allowing  pharmacy to be a part of this patient's care.  Alycia Rossetti, PharmD, BCPS Clinical Pharmacist Clinical phone for 04/24/2019: [phone] 04/24/2019 11:15 AM   **Pharmacist phone directory can now be found on [email or web address] ([email or web address]).  Listed under [place].  ---------------------------------------------------------------------------------------  Addendum: Plans are now to transition the patient to Xarelto - will wait to make this transition in the evening. Will plan to provide education prior to discharge.   Plan - D/c Heparin drip at 1659 today - Start Xarelto 15 mg bid x 21d followed by 20 mg once daily with supper - Transition discussed with RN Nira Conn) - Education sheet placed in AVS, will provide education prior to discharge - Will monitor for bleeding  Alycia Rossetti, PharmD, BCPS 2:10 PM

## 2019-04-24 NOTE — Progress Notes (Signed)
PT Cancellation Note  Patient Details Name: Brittany Christian MRN: [id number] DOB: [birth year]   Cancelled Treatment:    Reason Eval/Treat Not Completed: Medical issues which prohibited therapy. Patient found to have multiple PEs. On heparin drip currently. Will evaluate patient when medically appropriate.     Sylina Henion 04/24/2019, 3:02 PM

## 2019-04-24 NOTE — Discharge Instructions (Signed)
Information on my medicine - XARELTO (rivaroxaban)  This medication education was reviewed with me or my healthcare representative as part of my discharge preparation.    WHY WAS XARELTO PRESCRIBED FOR YOU? Xarelto was prescribed to treat blood clots that may have been found in the veins of your legs (deep vein thrombosis) or in your lungs (pulmonary embolism) and to reduce the risk of them occurring again.  What do you need to know about Xarelto? The starting dose is one 15 mg tablet taken TWICE daily with food for the FIRST 21 DAYS then on Monday, August 17th 2020 the dose is changed to one 20 mg tablet taken ONCE A DAY with your evening meal.  DO NOT stop taking Xarelto without talking to the health care provider who prescribed the medication.  Refill your prescription for 20 mg tablets before you run out.  After discharge, you should have regular check-up appointments with your healthcare provider that is prescribing your Xarelto.  In the future your dose may need to be changed if your kidney function changes by a significant amount.  What do you do if you miss a dose? If you are taking Xarelto TWICE DAILY and you miss a dose, take it as soon as you remember. You may take two 15 mg tablets (total 30 mg) at the same time then resume your regularly scheduled 15 mg twice daily the next day.  If you are taking Xarelto ONCE DAILY and you miss a dose, take it as soon as you remember on the same day then continue your regularly scheduled once daily regimen the next day. Do not take two doses of Xarelto at the same time.   Important Safety Information Xarelto is a blood thinner medicine that can cause bleeding. You should call your healthcare provider right away if you experience any of the following: ? Bleeding from an injury or your nose that does not stop. ? Unusual colored urine (red or dark brown) or unusual colored stools (red or black). ? Unusual bruising for unknown reasons. ? A  serious fall or if you hit your head (even if there is no bleeding).  Some medicines may interact with Xarelto and might increase your risk of bleeding while on Xarelto. To help avoid this, consult your healthcare provider or pharmacist prior to using any new prescription or non-prescription medications, including herbals, vitamins, non-steroidal anti-inflammatory drugs (NSAIDs) and supplements.  This website has more information on Xarelto: [email or web address]

## 2019-04-24 NOTE — Progress Notes (Addendum)
PROGRESS NOTE    Brittany Christian  DUK:[id number] DOB: Nov 10, 1930 DOA: 04/22/2019 PCP: Madelyn Brunner, MD    Brief Narrative:  85 year old female who presented with syncope.  He does have significant past medical history for hypertension, dyslipidemia, dementia, subdural hematoma and breast cancer.  Patient sustained a syncope episode while being in the bathroom.  She was unconscious for about 10 to 13 minutes.  When EMS arrived her oxygen saturation was 89% on room air and blood pressure 60/30.  After 1.5 L of intravenous normal saline and supplemental oxygen (2LPM), blood pressure 143/74, respiratory rate 23, temperature 96.3, oxygen saturation 96%.  Her lungs were clear to auscultation bilaterally, heart S1-S2 present and rhythmic, abdomen was soft, positive bilateral lower extremity edema, patient was awake but disoriented, non focal.  Sodium 137, potassium 3.7, chloride 107, bicarb 19, glucose 192, BUN 12, creatinine 0.82, high sensitive troponin 12.  White count 5.6, hemoglobin 8.5, hematocrit 28.3, platelets 172.  D-dimer more than 20, SARS COVID-19 negative.  Urine analysis negative for infection.  Head CT with atrophy, extensive small vessel chronic ischemic changes, no cervical spine acute changes.  Chest x-ray with increase interstitial markings on the right side, CT chest with faint groundglass opacities in the right, positive for pulmonary embolism including a small saddle embolus, large burden nonocclusive emboli scattered throughout multiple lobar, segmental and subsegmental sized branches in the pulmonary artery tree bilaterally.  EKG 110 bpm, normal axis, normal intervals, sinus rhythm, no ST segment or T wave changes.  Patient was admitted to the hospital working diagnosis of acute hypoxic respiratory failure complicated by syncope due to acute bilateral  pulmonary embolism.   Assessment & Plan:   Principal Problem:   Syncope Active Problems:   Acute respiratory failure with  hypoxia (HCC)   Hypotension   Hypothermia   HLD (hyperlipidemia)   Normocytic anemia   Lactic acidosis   HTN (hypertension)   Elevated troponin   Pulmonary embolism ([place])   1. Acute hypoxic respiratory failure due to bilateral acute pulmonary embolism, (saddle)/ bilateral lower extremities DVT. Patient with severe sedentary lifestyle due to dementia. [place] lower extremtities with bilateral DVT, right peroneal and left soleal. Considered provoked DVT, but likely will need prolonged anticoagulation. She has remained on room air, with oxygenation at 100%. Blood pressure systolic 376 to 283 mmHg. Tolerating anticoagulation well. Echocardiogram with preserved RV and LV systolic function with mild elevation of RV pressure 37,3 mmHg. Will transition to rivaroxaban for anticoagulation, continue oxymetry, telemetry and blood pressure monitoring. DC iv fluids.   2. Acute hypovolemic hypotension. Improved blood pressure with systolic 151 to 761 mmHg. Hold on IV fluids for now. Continue to hold on lisinopril for now.   3. Dementia with metabolic encephalopathy. Improved mentation, no agitation, will liberate her diet due to poor oral intake. Will consult nutrition and physical therapy evaluation.   4. Chronic anemia. Hgb has drooped from 8,4 to 7,7,. No signs of bleeding, continue anticoagulation, follow on cell count in am.   5. Dyslipidemia. On atorvasatatin.    DVT prophylaxis: rivaroxaban.   Code Status: dnr  Family Communication: no family at the bedside   Body mass index is 22.6 kg/m. Malnutrition Type:      Malnutrition Characteristics:      Nutrition Interventions:     RN Pressure Injury Documentation:     Consultants:     Procedures:     Antimicrobials:      Subjective: Patient is more awake and alert,  not much interactive, but no signs of pain or dyspnea, poor appetite. All information from nursing.   Objective: Vitals:   04/23/19 1435 04/23/19 1451  04/23/19 2047 04/24/19 0700  BP: (!) 175/77  (!) 153/88 135/74  Pulse:   96 90  Resp: 20 (!) 24    Temp:  97.9 F (36.6 C) 98.5 F (36.9 C) 98.6 F (37 C)  TempSrc:  Axillary Axillary Oral  SpO2:   100%   Weight:    61.6 kg  Height:        Intake/Output Summary (Last 24 hours) at 04/24/2019 1245 Last data filed at 04/24/2019 0958 Gross per 24 hour  Intake 168.33 ml  Output 400 ml  Net -231.67 ml   Filed Weights   04/22/19 2354 04/24/19 0700  Weight: 49.9 kg 61.6 kg    Examination:   General: deconditioned  Neurology: Awake and alert, non focal, not much interactive. Presumed baseline due to dementia.   E ENT: no pallor, no icterus, oral mucosa moist Cardiovascular: No JVD. S1-S2 present, rhythmic, no gallops, rubs, or murmurs. No lower extremity edema. Pulmonary: positive breath sounds bilaterally, adequate air movement, no wheezing, rhonchi or rales. Gastrointestinal. Abdomen with no organomegaly, non tender, no rebound or guarding Skin. No rashes Musculoskeletal: no joint deformities     Data Reviewed: I have personally reviewed following labs and imaging studies  CBC: Recent Labs  Lab 04/22/19 1752 04/23/19 0341 04/24/19 0438  WBC 5.6 7.0 6.9  NEUTROABS 3.1  --   --   HGB 8.5* 8.4* 7.7*  HCT 28.3* 27.4* 24.7*  MCV 87.9 88.4 86.7  PLT 172 189 941   Basic Metabolic Panel: Recent Labs  Lab 04/22/19 1752 04/24/19 0438  NA 137 141  K 3.7 3.5  CL 107 108  CO2 19* 22  GLUCOSE 192* 111*  BUN 12 8  CREATININE 0.82 0.66  CALCIUM 8.6* 8.9   GFR: Estimated Creatinine Clearance: 45.4 mL/min (by C-G formula based on SCr of 0.66 mg/dL). Liver Function Tests: Recent Labs  Lab 04/22/19 1752  AST 28  ALT 17  ALKPHOS 57  BILITOT 0.8  PROT 5.5*  ALBUMIN 2.6*   No results for input(s): LIPASE, AMYLASE in the last 168 hours. No results for input(s): AMMONIA in the last 168 hours. Coagulation Profile: Recent Labs  Lab 04/23/19 0237  INR 1.2    Cardiac Enzymes: No results for input(s): CKTOTAL, CKMB, CKMBINDEX, TROPONINI in the last 168 hours. BNP (last 3 results) No results for input(s): PROBNP in the last 8760 hours. HbA1C: Recent Labs    04/23/19 0341  HGBA1C 6.0*   CBG: Recent Labs  Lab 04/22/19 1733 04/23/19 0554 04/24/19 0807  GLUCAP 160* 120* 96   Lipid Profile: Recent Labs    04/23/19 0341  CHOL 151  HDL 77  LDLCALC 68  TRIG 29  CHOLHDL 2.0   Thyroid Function Tests: No results for input(s): TSH, T4TOTAL, FREET4, T3FREE, THYROIDAB in the last 72 hours. Anemia Panel: No results for input(s): VITAMINB12, FOLATE, FERRITIN, TIBC, IRON, RETICCTPCT in the last 72 hours.    Radiology Studies: I have reviewed all of the imaging during this hospital visit personally     Scheduled Meds: . atorvastatin  40 mg Oral q1800  . sodium chloride flush  3 mL Intravenous Q12H  . vitamin B-12  1,000 mcg Oral Daily   Continuous Infusions: . heparin 800 Units/hr (04/24/19 0522)     LOS: 1 day  Tesneem Dufrane Gerome Apley, MD

## 2019-04-25 LAB — CBC WITH DIFFERENTIAL/PLATELET
Abs Immature Granulocytes: 0.02 10*3/uL (ref 0.00–0.07)
Basophils Absolute: 0.1 10*3/uL (ref 0.0–0.1)
Basophils Relative: 1 %
Eosinophils Absolute: 0.3 10*3/uL (ref 0.0–0.5)
Eosinophils Relative: 4 %
HCT: 25 % — ABNORMAL LOW (ref 36.0–46.0)
Hemoglobin: 7.8 g/dL — ABNORMAL LOW (ref 12.0–15.0)
Immature Granulocytes: 0 %
Lymphocytes Relative: 26 %
Lymphs Abs: 1.6 10*3/uL (ref 0.7–4.0)
MCH: 26.5 pg (ref 26.0–34.0)
MCHC: 31.2 g/dL (ref 30.0–36.0)
MCV: 85 fL (ref 80.0–100.0)
Monocytes Absolute: 0.6 10*3/uL (ref 0.1–1.0)
Monocytes Relative: 10 %
Neutro Abs: 3.6 10*3/uL (ref 1.7–7.7)
Neutrophils Relative %: 59 %
Platelets: 219 10*3/uL (ref 150–400)
RBC: 2.94 MIL/uL — ABNORMAL LOW (ref 3.87–5.11)
RDW: 13.8 % (ref 11.5–15.5)
WBC: 6.2 10*3/uL (ref 4.0–10.5)
nRBC: 0 % (ref 0.0–0.2)

## 2019-04-25 LAB — CULTURE, BLOOD (ROUTINE X 2): Special Requests: ADEQUATE

## 2019-04-25 LAB — GLUCOSE, CAPILLARY: Glucose-Capillary: 86 mg/dL (ref 70–99)

## 2019-04-25 MED ORDER — RIVAROXABAN (XARELTO) VTE STARTER PACK (15 & 20 MG): ORAL_TABLET | ORAL | 0 refills | Status: AC

## 2019-04-25 NOTE — Discharge Summary (Signed)
Physician Discharge Summary  Brittany Christian VFI:[id number] DOB: [birth year] DOA: 04/22/2019  PCP: Madelyn Brunner, MD  Admit date: 04/22/2019 Discharge date: 04/25/2019  Admitted From: Home  Disposition:  Home   Recommendations for Outpatient Follow-up and new medication changes:  1. Follow up with Dr. Gilford Rile in 7 days.  2. Patient has been placed on Rivaroxaban for anticoagulation. 3. Hospice services will be arrange.   4. Patient's son agrees on anticoagulation and he is fully aware of risk of bleeding. I addressed the high importance of fall precautions.   Home Health: Hospice Equipment/Devices: no    Discharge Condition: stable  CODE STATUS: DNR  Diet recommendation: heart healthy    Brief/Interim Summary: 85 year oldfemale who presented with syncope. She does have significant past medical history for hypertension, dyslipidemia, dementia, traumatic subdural hematoma and breast cancer. Patient sustained a syncope episode while being in the bathroom. She was unconscious for about 10 to 13 minutes. When EMS arrived her oxygen saturation was 89% on room air andblood pressure 60/30. After 1.5 L of intravenous normal saline and supplemental oxygen (2LPM),blood pressure 143/74, respiratory rate 23, temperature 96.3, oxygen saturation 96%. Her lungs were clear to auscultation bilaterally, heart S1-S2 present and rhythmic, abdomen was soft, positive bilateral lower extremity edema, patient was awake but disoriented, non focal. Sodium 137, potassium 3.7, chloride 107, bicarb 19, glucose 192, BUN 12, creatinine 0.82, high sensitive troponin12.White count 5.6, hemoglobin 8.5, hematocrit 28.3, platelets 172.D-dimer more than 20,SARS COVID-19 negative. Urine analysis negative for infection.Head CT with atrophy, extensive small vessel chronic ischemic changes, no cervical spine acute changes.Chest x-ray with increase interstitial markings on the right side, CT chest with faint  groundglass opacities in the right, positive for pulmonary embolism including a small saddle embolus, large burden nonocclusive emboli scattered throughout multiple lobar, segmental and subsegmental sized branches in the pulmonary artery tree bilaterally. EKG 110 bpm, normal axis, normal intervals, sinus rhythm, no ST segment or T wave changes.  Patient was admitted to the hospital working diagnosis of acute hypoxic respiratory failure complicated by syncope due to acutebilateralpulmonary embolism.  1.  Acute hypoxic respiratory failure due to bilateral acute pulmonary embolism, saddle, bilateral lower extremity deep vein thrombosis.  Patient was admitted to the progressive care unit, she was placed on IV heparin for anticoagulation, further work-up with echocardiography showed preserved RV and LV systolic function, with mild elevation of RV pressure 37.3 mmHg.  Lower extremity ultrasonography positive for deep vein thrombosis in the right peroneal vein and left soleal vein. Her antihypertensive agents were held, she regained hemodynamic stability, discharge blood pressure 160/72, she was weaned off supplemental oxygen, at discharge her discharge oxygen saturation is 94%.  Now patient has been transitioned to rivaroxaban with good toleration.  2.  Hypertension.  Initially patient had hypotension, improved with IV fluids and anticoagulation.  At discharge her blood pressure systolic 416, she will resume lisinopril.  3.  Dementia with metabolic encephalopathy.  Her dementia seems to be advanced, she requires assistance with her basic activities of daily living, including ambulation, feeding and bathing.  She follows up with [facility].  4.  Chronic, multifactorial anemia.  Her hemoglobin had remained stable, 8.4, and 7.7, follow-up as an outpatient.  5.  History of traumatic subdural hematoma.  Old records personally reviewed, patient suffered from a subdural hematoma 2018, underwent craniotomy.   Patient follows up with [facility].  I had an extensive talk with her son at the bedside, now patient has extensive venous thromboembolism,  with a large clot burden.  Taking oral anticoagulants puts her at very high risk of bleeding including recurrent subdural hematomas.    Evidently anticoagulants will prolong her life for the time being but at the same time increases her risk of bleeding including life-threatening intracranial hemorrhage.  I have offered him hospice services that he  agrees on.  He also agrees on continue anticoagulation for now to prolong Brittany Christian life, but he is fully aware about the risks of potential bleeding.  Hospice services will assist as an outpatient.  Discharge Diagnoses:  Principal Problem:   Syncope Active Problems:   Acute respiratory failure with hypoxia (HCC)   Hypotension   Hypothermia   HLD (hyperlipidemia)   Normocytic anemia   Lactic acidosis   HTN (hypertension)   Elevated troponin   Pulmonary embolism ([place])    Discharge Instructions   Allergies as of 04/25/2019   No Known Allergies     Medication List    STOP taking these medications   feeding supplement (ENSURE ENLIVE) Liqd   polyethylene glycol 17 g packet Commonly known as: MIRALAX / GLYCOLAX     TAKE these medications   atorvastatin 10 MG tablet Commonly known as: LIPITOR Take 10 mg by mouth daily.   lisinopril 10 MG tablet Commonly known as: ZESTRIL Take 10 mg by mouth daily.   Rivaroxaban 15 & 20 MG Tbpk Take as directed on package: Start with one 15mg  tablet by mouth twice a day with food. On Day 22, switch to one 20mg  tablet once a day with food.   vitamin B-12 1000 MCG tablet Commonly known as: CYANOCOBALAMIN Take 1,000 mcg by mouth daily.       No Known Allergies  Consultations:  Hospice    Procedures/Studies: Ct Head Wo Contrast  Result Date: 04/22/2019 CLINICAL DATA:  Syncopal episode during a bowel movement, became unresponsive, lost  consciousness for 10-13 minutes, history dementia, hypertension, prior subdural hematoma EXAM: CT HEAD WITHOUT CONTRAST CT CERVICAL SPINE WITHOUT CONTRAST TECHNIQUE: Multidetector CT imaging of the head and cervical spine was performed following the standard protocol without intravenous contrast. Multiplanar CT image reconstructions of the cervical spine were also generated. COMPARISON:  CT head 12/25/2016 FINDINGS: CT HEAD FINDINGS Brain: Generalized atrophy. Ex vacuo dilatation of the ventricular system. No midline shift or mass effect. Small vessel chronic ischemic changes of deep cerebral white matter. Old lacunar infarct LEFT thalamus. No intracranial hemorrhage, mass lesion, or evidence of acute infarction. Dural thickening and calcification at the LEFT parietal region likely sequela of prior subdural hemorrhage and craniotomy. No new extra-axial fluid collections. Vascular: Atherosclerotic calcification of internal carotid arteries at skull base. No hyperdense vessels. Skull: Prior LEFT frontoparietal temporal craniotomy. Otherwise intact. Sinuses/Orbits: Clear Other: N/A CT CERVICAL SPINE FINDINGS Alignment: Mild retrolisthesis at C3-C4. Minimal anterolisthesis C7-T1. Skull base and vertebrae: Osseous demineralization. Skull base intact. Multilevel disc space narrowing and endplate spur formation greatest at C3-C4 where accompanying endplate sclerosis is also identified. No fracture, subluxation or bone destruction. Multilevel facet degenerative changes. Soft tissues and spinal canal: Prevertebral soft tissues normal thickness. Remaining cervical soft tissues unremarkable. Disc levels:  No specific abnormalities otherwise seen Upper chest: Lung apices clear Other: N/A IMPRESSION: Atrophy with extensive small vessel chronic ischemic changes of deep cerebral white matter. No acute intracranial abnormalities. Multilevel degenerative disc and facet disease changes of the cervical spine. No acute cervical spine  abnormalities. Electronically Signed   By: Lavonia Dana M.D.   On: 04/22/2019 19:02  Ct Angio Chest Pe W Or Wo Contrast  Result Date: 04/23/2019 CLINICAL DATA:  85 year old female with history of syncope, hypotension, oxygen desaturation and elevated D-dimer. Suspected pulmonary embolism. EXAM: CT ANGIOGRAPHY CHEST WITH CONTRAST TECHNIQUE: Multidetector CT imaging of the chest was performed using the standard protocol during bolus administration of intravenous contrast. Multiplanar CT image reconstructions and MIPs were obtained to evaluate the vascular anatomy. CONTRAST:  165mL OMNIPAQUE IOHEXOL 350 MG/ML SOLN COMPARISON:  No priors. FINDINGS: Cardiovascular: There is a small saddle embolus which extends into the pulmonary arteries bilaterally. Multiple additional filling defects are noted throughout lobar, segmental and subsegmental sized branches in the pulmonary arterial tree bilaterally, indicative of pulmonary embolus. These appear to be nonocclusive filling defects. Heart size is normal. There is no significant pericardial fluid, thickening or pericardial calcification. Pulmonic trunk is normal in caliber at this time measuring 2.4 cm. There is aortic atherosclerosis, as well as atherosclerosis of the great vessels of the mediastinum and the coronary arteries, including calcified atherosclerotic plaque in the left anterior descending, left circumflex and right coronary arteries. Mediastinum/Nodes: No pathologically enlarged mediastinal or hilar lymph nodes. Esophagus is unremarkable in appearance. No axillary lymphadenopathy. Lungs/Pleura: Areas of dependent atelectasis are noted in the right lower lobe. No acute consolidative airspace disease. No pleural effusions. Mild fibrosis and scarring in the medial aspect of the right lower lobe adjacent to several prominent osteophytes. No suspicious appearing pulmonary nodules or masses are noted. Upper Abdomen: Unremarkable. Musculoskeletal: Chronic compression  fracture of L1 with 20% loss of anterior vertebral body height. There are no aggressive appearing lytic or blastic lesions noted in the visualized portions of the skeleton. Review of the MIP images confirms the above findings. IMPRESSION: 1. Study is positive for pulmonary embolism, including a small saddle embolus, as well as a large burden of nonocclusive emboli scattered throughout multiple lobar, segmental and subsegmental sized branches in the pulmonary arterial tree bilaterally. 2. Aortic atherosclerosis, in addition to 3 vessel coronary artery disease. Critical Value/emergent results were called by telephone at the time of interpretation on 04/23/2019 at 4:57 am to Dr. Blaine Hamper, who verbally acknowledged these results. Aortic Atherosclerosis (ICD10-I70.0). Electronically Signed   By: Vinnie Langton M.D.   On: 04/23/2019 05:04   Ct Cervical Spine Wo Contrast  Result Date: 04/22/2019 CLINICAL DATA:  Syncopal episode during a bowel movement, became unresponsive, lost consciousness for 10-13 minutes, history dementia, hypertension, prior subdural hematoma EXAM: CT HEAD WITHOUT CONTRAST CT CERVICAL SPINE WITHOUT CONTRAST TECHNIQUE: Multidetector CT imaging of the head and cervical spine was performed following the standard protocol without intravenous contrast. Multiplanar CT image reconstructions of the cervical spine were also generated. COMPARISON:  CT head 12/25/2016 FINDINGS: CT HEAD FINDINGS Brain: Generalized atrophy. Ex vacuo dilatation of the ventricular system. No midline shift or mass effect. Small vessel chronic ischemic changes of deep cerebral white matter. Old lacunar infarct LEFT thalamus. No intracranial hemorrhage, mass lesion, or evidence of acute infarction. Dural thickening and calcification at the LEFT parietal region likely sequela of prior subdural hemorrhage and craniotomy. No new extra-axial fluid collections. Vascular: Atherosclerotic calcification of internal carotid arteries at skull  base. No hyperdense vessels. Skull: Prior LEFT frontoparietal temporal craniotomy. Otherwise intact. Sinuses/Orbits: Clear Other: N/A CT CERVICAL SPINE FINDINGS Alignment: Mild retrolisthesis at C3-C4. Minimal anterolisthesis C7-T1. Skull base and vertebrae: Osseous demineralization. Skull base intact. Multilevel disc space narrowing and endplate spur formation greatest at C3-C4 where accompanying endplate sclerosis is also identified. No fracture, subluxation or bone destruction. Multilevel  facet degenerative changes. Soft tissues and spinal canal: Prevertebral soft tissues normal thickness. Remaining cervical soft tissues unremarkable. Disc levels:  No specific abnormalities otherwise seen Upper chest: Lung apices clear Other: N/A IMPRESSION: Atrophy with extensive small vessel chronic ischemic changes of deep cerebral white matter. No acute intracranial abnormalities. Multilevel degenerative disc and facet disease changes of the cervical spine. No acute cervical spine abnormalities. Electronically Signed   By: Lavonia Dana M.D.   On: 04/22/2019 19:02   Dg Chest Port 1 View  Result Date: 04/22/2019 CLINICAL DATA:  Syncope x2 today. Hx of dementia. Pt's arm are partially contracted to the body and had to be held out of the lung field by xray staff. EXAM: PORTABLE CHEST 1 VIEW COMPARISON:  12/25/2016 FINDINGS: Cardiomediastinal silhouette is normal. Patient is rotated towards the LEFT. The lungs are free of focal consolidations and pleural effusions. No pulmonary edema. Skin folds overlie the chest. Moderate midthoracic spondylosis. IMPRESSION: No evidence for acute cardiopulmonary abnormality. Electronically Signed   By: Nolon Nations M.D.   On: 04/22/2019 18:20   Vas [place] Lower Extremity Venous (dvt)  Result Date: 04/23/2019  Lower Venous Study Indications: Pulmonary embolism, and Swelling.  Risk Factors: Patient has severe dementia. Limitations: Patient confusion/compliance. Comparison Study: No prior  study on file for comparison. Performing Technologist: Sharion Dove RVS  Examination Guidelines: A complete evaluation includes B-mode imaging, spectral Doppler, color Doppler, and power Doppler as needed of all accessible portions of each vessel. Bilateral testing is considered an integral part of a complete examination. Limited examinations for reoccurring indications may be performed as noted.  +---------+---------------+---------+-----------+----------+--------------+ RIGHT    CompressibilityPhasicitySpontaneityPropertiesSummary        +---------+---------------+---------+-----------+----------+--------------+ CFV      Full           Yes      Yes                                 +---------+---------------+---------+-----------+----------+--------------+ SFJ      Full                                                        +---------+---------------+---------+-----------+----------+--------------+ FV Prox  Full                                                        +---------+---------------+---------+-----------+----------+--------------+ FV Mid   Full                                                        +---------+---------------+---------+-----------+----------+--------------+ FV Distal               Yes      Yes                                 +---------+---------------+---------+-----------+----------+--------------+ PFV      Full                                                        +---------+---------------+---------+-----------+----------+--------------+  POP                                                   Not visualized +---------+---------------+---------+-----------+----------+--------------+ PTV      Full                                                        +---------+---------------+---------+-----------+----------+--------------+ PERO     None                                         Acute           +---------+---------------+---------+-----------+----------+--------------+   Right Technical Findings: Not visualized segments include popliteal vein.  +---------+---------------+---------+-----------+----------+--------------+ LEFT     CompressibilityPhasicitySpontaneityPropertiesSummary        +---------+---------------+---------+-----------+----------+--------------+ CFV      Full           Yes      Yes                                 +---------+---------------+---------+-----------+----------+--------------+ SFJ      Full                                                        +---------+---------------+---------+-----------+----------+--------------+ FV Prox  Full                                                        +---------+---------------+---------+-----------+----------+--------------+ FV Mid   Full                                                        +---------+---------------+---------+-----------+----------+--------------+ FV DistalFull                                                        +---------+---------------+---------+-----------+----------+--------------+ PFV      Full                                                        +---------+---------------+---------+-----------+----------+--------------+ POP      Full                                                        +---------+---------------+---------+-----------+----------+--------------+  PTV                                                   Not visualized +---------+---------------+---------+-----------+----------+--------------+ PERO                                                  Not visualized +---------+---------------+---------+-----------+----------+--------------+ Soleal   None                                         Acute          +---------+---------------+---------+-----------+----------+--------------+   Left Technical Findings: Not visualized  segments include posterior tibial and peroneal veins.   Summary: Right: Findings consistent with acute deep vein thrombosis involving the right peroneal veins. Left: Findings consistent with acute deep vein thrombosis involving the left soleal veins.  *See table(s) above for measurements and observations. Electronically signed by Deitra Mayo MD on 04/23/2019 at 7:10:06 PM.    Final       Procedures:   Subjective: Patient is feeling better, no pain, no nausea or vomiting, no evident dyspnea.   Discharge Exam: Vitals:   04/24/19 2011 04/25/19 0551  BP: (!) 146/83 (!) 160/72  Pulse: 80 85  Resp: 14 (!) 23  Temp: (!) 97.3 F (36.3 C) (!) 97.5 F (36.4 C)  SpO2:     Vitals:   04/24/19 1545 04/24/19 2011 04/25/19 0551 04/25/19 0609  BP: (!) 145/79 (!) 146/83 (!) 160/72   Pulse: 75 80 85   Resp: 14 14 (!) 23   Temp: 98.5 F (36.9 C) (!) 97.3 F (36.3 C) (!) 97.5 F (36.4 C)   TempSrc: Oral Oral Oral   SpO2: 94%     Weight:    62.6 kg  Height:        General: not in pain or dyspnea Neurology: Awake and alert, non focal  E ENT: no pallor, no icterus, oral mucosa moist Cardiovascular: No JVD. S1-S2 present, rhythmic, no gallops, rubs, or murmurs. Positive lower extremity edema. Pulmonary: positive breath sounds bilaterally, adequate air movement, no wheezing, rhonchi or rales. Gastrointestinal. Abdomen with no organomegaly, non tender, no rebound or guarding Skin. No rashes Musculoskeletal: no joint deformities   The results of significant diagnostics from this hospitalization (including imaging, microbiology, ancillary and laboratory) are listed below for reference.     Microbiology: Recent Results (from the past 240 hour(s))  SARS Coronavirus 2 (CEPHEID - Performed in [facility] hospital lab), Hosp Order     Status: None   Collection Time: 04/22/19  6:06 PM   Specimen: Nasopharyngeal Swab  Result Value Ref Range Status   SARS Coronavirus 2 NEGATIVE NEGATIVE  Final    Comment: (NOTE) If result is NEGATIVE SARS-CoV-2 target nucleic acids are NOT DETECTED. The SARS-CoV-2 RNA is generally detectable in upper and lower  respiratory specimens during the acute phase of infection. The lowest  concentration of SARS-CoV-2 viral copies this assay can detect is 250  copies / mL. A negative result does not preclude SARS-CoV-2 infection  and should not be used as the sole basis for treatment or other  patient  management decisions.  A negative result may occur with  improper specimen collection / handling, submission of specimen other  than nasopharyngeal swab, presence of viral mutation(s) within the  areas targeted by this assay, and inadequate number of viral copies  (<250 copies / mL). A negative result must be combined with clinical  observations, patient history, and epidemiological information. If result is POSITIVE SARS-CoV-2 target nucleic acids are DETECTED. The SARS-CoV-2 RNA is generally detectable in upper and lower  respiratory specimens dur ing the acute phase of infection.  Positive  results are indicative of active infection with SARS-CoV-2.  Clinical  correlation with patient history and other diagnostic information is  necessary to determine patient infection status.  Positive results do  not rule out bacterial infection or co-infection with other viruses. If result is PRESUMPTIVE POSTIVE SARS-CoV-2 nucleic acids MAY BE PRESENT.   A presumptive positive result was obtained on the submitted specimen  and confirmed on repeat testing.  While 2019 novel coronavirus  (SARS-CoV-2) nucleic acids may be present in the submitted sample  additional confirmatory testing may be necessary for epidemiological  and / or clinical management purposes  to differentiate between  SARS-CoV-2 and other Sarbecovirus currently known to infect humans.  If clinically indicated additional testing with an alternate test  methodology [phone]) is advised. The  SARS-CoV-2 RNA is generally  detectable in upper and lower respiratory sp ecimens during the acute  phase of infection. The expected result is Negative. Fact Sheet for Patients:  StrictlyIdeas.no Fact Sheet for Healthcare Providers: BankingDealers.co.za This test is not yet approved or cleared by the [place] FDA and has been authorized for detection and/or diagnosis of SARS-CoV-2 by FDA under an Emergency Use Authorization (EUA).  This EUA will remain in effect (meaning this test can be used) for the duration of the COVID-19 declaration under Section 564(b)(1) of the Act, 21 U.S.C. section 360bbb-3(b)(1), unless the authorization is terminated or revoked sooner. Performed at [facility], [place] [address], [place], [place] [address]   Blood Cultures (routine x 2)     Status: Abnormal   Collection Time: 04/22/19  6:11 PM   Specimen: BLOOD  Result Value Ref Range Status   Specimen Description BLOOD LEFT ANTECUBITAL  Final   Special Requests   Final    BOTTLES DRAWN AEROBIC AND ANAEROBIC Blood Culture adequate volume   Culture  Setup Time   Final    GRAM POSITIVE COCCI IN BOTH AEROBIC AND ANAEROBIC BOTTLES CRITICAL RESULT CALLED TO, READ BACK BY AND VERIFIED WITH: PHARMD [place] [id number] FCP     Culture (A)  Final    STAPHYLOCOCCUS SPECIES (COAGULASE NEGATIVE) THE SIGNIFICANCE OF ISOLATING THIS ORGANISM FROM A SINGLE SET OF BLOOD CULTURES WHEN MULTIPLE SETS ARE DRAWN IS UNCERTAIN. PLEASE NOTIFY THE MICROBIOLOGY DEPARTMENT WITHIN ONE WEEK IF SPECIATION AND SENSITIVITIES ARE REQUIRED. Performed at [facility], [place] [address], [place], [place] [address]    Report Status 04/25/2019 FINAL  Final  Blood Culture ID Panel (Reflexed)     Status: Abnormal   Collection Time: 04/22/19  6:11 PM  Result Value Ref Range Status   Enterococcus species NOT DETECTED NOT DETECTED Final   Listeria monocytogenes NOT DETECTED NOT  DETECTED Final   Staphylococcus species DETECTED (A) NOT DETECTED Final    Comment: Methicillin (oxacillin) resistant coagulase negative staphylococcus. Possible blood culture contaminant (unless isolated from more than one blood culture draw or clinical case suggests pathogenicity). No antibiotic treatment is  indicated for blood  culture contaminants. CRITICAL RESULT CALLED TO, READ BACK BY AND VERIFIED WITH: PHARMD LINDA MARTIN [phone] FCP     Staphylococcus aureus (BCID) NOT DETECTED NOT DETECTED Final   Methicillin resistance DETECTED (A) NOT DETECTED Final    Comment: CRITICAL RESULT CALLED TO, READ BACK BY AND VERIFIED WITH: PHARMD LINDA MARTIN [id number] FCP     Streptococcus species NOT DETECTED NOT DETECTED Final   Streptococcus agalactiae NOT DETECTED NOT DETECTED Final   Streptococcus pneumoniae NOT DETECTED NOT DETECTED Final   Streptococcus pyogenes NOT DETECTED NOT DETECTED Final   Acinetobacter baumannii NOT DETECTED NOT DETECTED Final   Enterobacteriaceae species NOT DETECTED NOT DETECTED Final   Enterobacter cloacae complex NOT DETECTED NOT DETECTED Final   Escherichia coli NOT DETECTED NOT DETECTED Final   Klebsiella oxytoca NOT DETECTED NOT DETECTED Final   Klebsiella pneumoniae NOT DETECTED NOT DETECTED Final   Proteus species NOT DETECTED NOT DETECTED Final   Serratia marcescens NOT DETECTED NOT DETECTED Final   Haemophilus influenzae NOT DETECTED NOT DETECTED Final   Neisseria meningitidis NOT DETECTED NOT DETECTED Final   Pseudomonas aeruginosa NOT DETECTED NOT DETECTED Final   Candida albicans NOT DETECTED NOT DETECTED Final   Candida glabrata NOT DETECTED NOT DETECTED Final   Candida krusei NOT DETECTED NOT DETECTED Final   Candida parapsilosis NOT DETECTED NOT DETECTED Final   Candida tropicalis NOT DETECTED NOT DETECTED Final    Comment: Performed at [facility], [place]. [address], [place], [place] [address]  Blood Cultures (routine x 2)      Status: None (Preliminary result)   Collection Time: 04/22/19  6:40 PM   Specimen: BLOOD RIGHT HAND  Result Value Ref Range Status   Specimen Description BLOOD RIGHT HAND  Final   Special Requests   Final    BOTTLES DRAWN AEROBIC ONLY Blood Culture results may not be optimal due to an inadequate volume of blood received in culture bottles   Culture   Final    NO GROWTH 3 DAYS Performed at [facility], [place] [address] [place], [place] [address]    Report Status PENDING  Incomplete  Urine culture     Status: Abnormal   Collection Time: 04/22/19  9:34 PM   Specimen: Urine, Clean Catch  Result Value Ref Range Status   Specimen Description URINE, CLEAN CATCH  Final   Special Requests   Final    NONE Performed at [facility], [place] [address], [place], [place] [address]    Culture MULTIPLE SPECIES PRESENT, SUGGEST RECOLLECTION (A)  Final   Report Status 04/24/2019 FINAL  Final     Labs: BNP (last 3 results) Recent Labs    04/22/19 2217  BNP 70.2   Basic Metabolic Panel: Recent Labs  Lab 04/22/19 1752 04/24/19 0438  NA 137 141  K 3.7 3.5  CL 107 108  CO2 19* 22  GLUCOSE 192* 111*  BUN 12 8  CREATININE 0.82 0.66  CALCIUM 8.6* 8.9   Liver Function Tests: Recent Labs  Lab 04/22/19 1752  AST 28  ALT 17  ALKPHOS 57  BILITOT 0.8  PROT 5.5*  ALBUMIN 2.6*   No results for input(s): LIPASE, AMYLASE in the last 168 hours. No results for input(s): AMMONIA in the last 168 hours. CBC: Recent Labs  Lab 04/22/19 1752 04/23/19 0341 04/24/19 0438  WBC 5.6 7.0 6.9  NEUTROABS 3.1  --   --   HGB 8.5* 8.4*  7.7*  HCT 28.3* 27.4* 24.7*  MCV 87.9 88.4 86.7  PLT 172 189 212   Cardiac Enzymes: No results for input(s): CKTOTAL, CKMB, CKMBINDEX, TROPONINI in the last 168 hours. BNP: Invalid input(s): POCBNP CBG: Recent Labs  Lab 04/22/19 1733 04/23/19 0554 04/24/19 0807 04/25/19 0740  GLUCAP 160* 120* 96 86   D-Dimer Recent Labs    04/23/19 0238   DDIMER >20.00*   Hgb A1c Recent Labs    04/23/19 0341  HGBA1C 6.0*   Lipid Profile Recent Labs    04/23/19 0341  CHOL 151  HDL 77  LDLCALC 68  TRIG 29  CHOLHDL 2.0   Thyroid function studies No results for input(s): TSH, T4TOTAL, T3FREE, THYROIDAB in the last 72 hours.  Invalid input(s): FREET3 Anemia work up No results for input(s): VITAMINB12, FOLATE, FERRITIN, TIBC, IRON, RETICCTPCT in the last 72 hours. Urinalysis    Component Value Date/Time   COLORURINE COLORLESS (A) 04/22/2019 2134   APPEARANCEUR CLEAR 04/22/2019 2134   LABSPEC 1.003 (L) 04/22/2019 2134   PHURINE 8.0 04/22/2019 2134   GLUCOSEU NEGATIVE 04/22/2019 2134   HGBUR NEGATIVE 04/22/2019 2134   BILIRUBINUR NEGATIVE 04/22/2019 2134   KETONESUR NEGATIVE 04/22/2019 2134   PROTEINUR NEGATIVE 04/22/2019 2134   NITRITE NEGATIVE 04/22/2019 2134   LEUKOCYTESUR NEGATIVE 04/22/2019 2134   Sepsis Labs Invalid input(s): PROCALCITONIN,  WBC,  LACTICIDVEN Microbiology Recent Results (from the past 240 hour(s))  SARS Coronavirus 2 (CEPHEID - Performed in [place] hospital lab), Hosp Order     Status: None   Collection Time: 04/22/19  6:06 PM   Specimen: Nasopharyngeal Swab  Result Value Ref Range Status   SARS Coronavirus 2 NEGATIVE NEGATIVE Final    Comment: (NOTE) If result is NEGATIVE SARS-CoV-2 target nucleic acids are NOT DETECTED. The SARS-CoV-2 RNA is generally detectable in upper and lower  respiratory specimens during the acute phase of infection. The lowest  concentration of SARS-CoV-2 viral copies this assay can detect is 250  copies / mL. A negative result does not preclude SARS-CoV-2 infection  and should not be used as the sole basis for treatment or other  patient management decisions.  A negative result may occur with  improper specimen collection / handling, submission of specimen other  than nasopharyngeal swab, presence of viral mutation(s) within the  areas targeted by this assay,  and inadequate number of viral copies  (<250 copies / mL). A negative result must be combined with clinical  observations, patient history, and epidemiological information. If result is POSITIVE SARS-CoV-2 target nucleic acids are DETECTED. The SARS-CoV-2 RNA is generally detectable in upper and lower  respiratory specimens dur ing the acute phase of infection.  Positive  results are indicative of active infection with SARS-CoV-2.  Clinical  correlation with patient history and other diagnostic information is  necessary to determine patient infection status.  Positive results do  not rule out bacterial infection or co-infection with other viruses. If result is PRESUMPTIVE POSTIVE SARS-CoV-2 nucleic acids MAY BE PRESENT.   A presumptive positive result was obtained on the submitted specimen  and confirmed on repeat testing.  While 2019 novel coronavirus  (SARS-CoV-2) nucleic acids may be present in the submitted sample  additional confirmatory testing may be necessary for epidemiological  and / or clinical management purposes  to differentiate between  SARS-CoV-2 and other Sarbecovirus currently known to infect humans.  If clinically indicated additional testing with an alternate test  methodology [phone]) is advised. The SARS-CoV-2 RNA is generally  detectable in upper and lower respiratory sp ecimens during the acute  phase of infection. The expected result is Negative. Fact Sheet for Patients:  StrictlyIdeas.no Fact Sheet for Healthcare Providers: BankingDealers.co.za This test is not yet approved or cleared by the [place] FDA and has been authorized for detection and/or diagnosis of SARS-CoV-2 by FDA under an Emergency Use Authorization (EUA).  This EUA will remain in effect (meaning this test can be used) for the duration of the COVID-19 declaration under Section 564(b)(1) of the Act, 21 U.S.C. section 360bbb-3(b)(1), unless  the authorization is terminated or revoked sooner. Performed at [facility], [place] [address], [place], [place] [address]   Blood Cultures (routine x 2)     Status: Abnormal   Collection Time: 04/22/19  6:11 PM   Specimen: BLOOD  Result Value Ref Range Status   Specimen Description BLOOD LEFT ANTECUBITAL  Final   Special Requests   Final    BOTTLES DRAWN AEROBIC AND ANAEROBIC Blood Culture adequate volume   Culture  Setup Time   Final    GRAM POSITIVE COCCI IN BOTH AEROBIC AND ANAEROBIC BOTTLES CRITICAL RESULT CALLED TO, READ BACK BY AND VERIFIED WITH: PHARMD [place] [phone] FCP     Culture (A)  Final    STAPHYLOCOCCUS SPECIES (COAGULASE NEGATIVE) THE SIGNIFICANCE OF ISOLATING THIS ORGANISM FROM A SINGLE SET OF BLOOD CULTURES WHEN MULTIPLE SETS ARE DRAWN IS UNCERTAIN. PLEASE NOTIFY THE MICROBIOLOGY DEPARTMENT WITHIN ONE WEEK IF SPECIATION AND SENSITIVITIES ARE REQUIRED. Performed at [facility], [place] [address], [place], [place] [address]    Report Status 04/25/2019 FINAL  Final  Blood Culture ID Panel (Reflexed)     Status: Abnormal   Collection Time: 04/22/19  6:11 PM  Result Value Ref Range Status   Enterococcus species NOT DETECTED NOT DETECTED Final   Listeria monocytogenes NOT DETECTED NOT DETECTED Final   Staphylococcus species DETECTED (A) NOT DETECTED Final    Comment: Methicillin (oxacillin) resistant coagulase negative staphylococcus. Possible blood culture contaminant (unless isolated from more than one blood culture draw or clinical case suggests pathogenicity). No antibiotic treatment is indicated for blood  culture contaminants. CRITICAL RESULT CALLED TO, READ BACK BY AND VERIFIED WITH: PHARMD LINDA MARTIN [phone] FCP     Staphylococcus aureus (BCID) NOT DETECTED NOT DETECTED Final   Methicillin resistance DETECTED (A) NOT DETECTED Final    Comment: CRITICAL RESULT CALLED TO, READ BACK BY AND VERIFIED WITH: PHARMD LINDA MARTIN [phone]  FCP     Streptococcus species NOT DETECTED NOT DETECTED Final   Streptococcus agalactiae NOT DETECTED NOT DETECTED Final   Streptococcus pneumoniae NOT DETECTED NOT DETECTED Final   Streptococcus pyogenes NOT DETECTED NOT DETECTED Final   Acinetobacter baumannii NOT DETECTED NOT DETECTED Final   Enterobacteriaceae species NOT DETECTED NOT DETECTED Final   Enterobacter cloacae complex NOT DETECTED NOT DETECTED Final   Escherichia coli NOT DETECTED NOT DETECTED Final   Klebsiella oxytoca NOT DETECTED NOT DETECTED Final   Klebsiella pneumoniae NOT DETECTED NOT DETECTED Final   Proteus species NOT DETECTED NOT DETECTED Final   Serratia marcescens NOT DETECTED NOT DETECTED Final   Haemophilus influenzae NOT DETECTED NOT DETECTED Final   Neisseria meningitidis NOT DETECTED NOT DETECTED Final   Pseudomonas aeruginosa NOT DETECTED NOT DETECTED Final   Candida albicans NOT DETECTED NOT DETECTED Final   Candida glabrata NOT DETECTED NOT DETECTED Final   Candida krusei NOT DETECTED NOT DETECTED Final   Candida parapsilosis  NOT DETECTED NOT DETECTED Final   Candida tropicalis NOT DETECTED NOT DETECTED Final    Comment: Performed at [facility], [place] [address], [place], [place] [address]  Blood Cultures (routine x 2)     Status: None (Preliminary result)   Collection Time: 04/22/19  6:40 PM   Specimen: BLOOD RIGHT HAND  Result Value Ref Range Status   Specimen Description BLOOD RIGHT HAND  Final   Special Requests   Final    BOTTLES DRAWN AEROBIC ONLY Blood Culture results may not be optimal due to an inadequate volume of blood received in culture bottles   Culture   Final    NO GROWTH 3 DAYS Performed at [facility], [place] [address], [place], [place] [address]    Report Status PENDING  Incomplete  Urine culture     Status: Abnormal   Collection Time: 04/22/19  9:34 PM   Specimen: Urine, Clean Catch  Result Value Ref Range Status   Specimen Description URINE, CLEAN CATCH  Final    Special Requests   Final    NONE Performed at [facility], [place] [address], [place], [place] [address]    Culture MULTIPLE SPECIES PRESENT, SUGGEST RECOLLECTION (A)  Final   Report Status 04/24/2019 FINAL  Final     Time coordinating discharge: 45 minutes  SIGNED:   Tawni Millers, MD  [facility] 04/25/2019, 9:29 AM

## 2019-04-25 NOTE — Progress Notes (Signed)
Manufacturing engineer (ACC)   Received referral for hospice services at home once discharged from [facility].    Spoke with son Dallas Breeding to confirm interest and DME needs.  Cedric states they have everything except a 3in1.  Will order from [employer], but not necessary prior to transporting home.    ACC will call Cedric today to schedule first visit with hospice.  Please send completed DNR home with pt.    Please fax d/c summary to:  [phone]  Thank you, Venia Carbon RN, BSN, [facility] Liaison (in [place]) [phone] (main #)

## 2019-04-25 NOTE — TOC Initial Note (Signed)
Transition of Care [facility]) - Initial/Assessment Note    Patient Details  Name: Brittany Christian MRN: [id number] Date of Birth: 11/02/1930  [facility] CM/SW Contact:    Bethena Roys, RN Phone Number: 04/25/2019, 12:13 PM  Clinical Narrative:  Pt presented for a syncopal episode. PTA from home with son. Son is the patients caregiver and he feels that patient is Hospice eligible. CM did speak with son and he wanted to use [facility] -Referral sent to [facility] and she reached out to patient's son. DME needs for 3n1 and this was relayed to [facility] DME should be delivered to the home. Address changed in Epic to reflect new address. Son to provide transportation home and Hospice should correlate visit times with the patient's son.  No further needs identified  at this time from CM.               Expected Discharge Plan: Home w Hospice Care Barriers to Discharge: No Barriers Identified   Patient Goals and CMS Choice Patient states their goals for this hospitalization and ongoing recovery are:: son wants to make the patient comfortable at his home. CMS Medicare.gov Compare Post Acute Care list provided to:: Patient Represenative (must comment)(Son [name]) Choice offered to / list presented to : Adult Children  Expected Discharge Plan and Services Expected Discharge Plan: Home w Hospice Care In-house Referral: NA Discharge Planning Services: CM Consult Post Acute Care Choice: Hospice Living arrangements for the past 2 months: Single Family Home Expected Discharge Date: 04/25/19                         HH Arranged: RN [place] Agency: Hospice and [place] Date [place]: 04/25/19 Time HH Agency Contacted: 1000 Representative spoke with at [place]: Anderson Malta  Prior Living Arrangements/Services Living arrangements for the past 2 months: Single Family Home Lives with:: Adult Children Patient language and need for  interpreter reviewed:: Yes Do you feel safe going back to the place where you live?: Yes      Need for Family Participation in Patient Care: Yes (Comment) Care giver support system in place?: Yes (comment)   Criminal Activity/Legal Involvement Pertinent to Current Situation/Hospitalization: No - Comment as needed  Activities of Daily Living   ADL Screening (condition at time of admission) Patient's cognitive ability adequate to safely complete daily activities?: No Is the patient deaf or have difficulty hearing?: No Does the patient have difficulty seeing, even when wearing glasses/contacts?: No Does the patient have difficulty concentrating, remembering, or making decisions?: Yes Patient able to express need for assistance with ADLs?: No Does the patient have difficulty dressing or bathing?: Yes Independently performs ADLs?: No Communication: Dependent Is this a change from baseline?: Pre-admission baseline Dressing (OT): Dependent Is this a change from baseline?: Pre-admission baseline Grooming: Dependent Is this a change from baseline?: Pre-admission baseline Feeding: Dependent Is this a change from baseline?: Pre-admission baseline Bathing: Dependent Is this a change from baseline?: Pre-admission baseline Toileting: Dependent Is this a change from baseline?: Pre-admission baseline In/Out Bed: Dependent Is this a change from baseline?: Pre-admission baseline Walks in Home: Dependent Is this a change from baseline?: Pre-admission baseline Does the patient have difficulty walking or climbing stairs?: Yes Weakness of Legs: Both Weakness of Arms/Hands: Both  Permission Sought/Granted Permission sought to share information with : Family Supports Permission granted to share information with : Yes, Verbal Permission Granted     Permission  granted to share info w AGENCY: [place]        Emotional Assessment Appearance:: Appears stated  age Attitude/Demeanor/Rapport: Unable to Assess Affect (typically observed): Unable to Assess Orientation: : Oriented to Self Alcohol / Substance Use: Not Applicable Psych Involvement: No (comment)  Admission diagnosis:  Syncope, unspecified syncope type [R55] Patient Active Problem List   Diagnosis Date Noted  . Pulmonary embolism ([place]) 04/23/2019  . Syncope 04/22/2019  . Hypotension 04/22/2019  . Hypothermia 04/22/2019  . HLD (hyperlipidemia) 04/22/2019  . Normocytic anemia 04/22/2019  . Lactic acidosis 04/22/2019  . HTN (hypertension) 04/22/2019  . Elevated troponin 04/22/2019  . Impairment of cognitive function   . Closed head injury   . Acute respiratory failure with hypoxia ([place])   . Hypomagnesemia   . Subdural hematoma ([place]) 12/24/2016  . Altered mental status   . SDH (subdural hematoma) ([place])   . AKI (acute kidney injury) ([place])   . Hypertensive urgency 05/10/2016   PCP:  Madelyn Brunner, MD Pharmacy:   [facility], [address] [place] [address] Phone: [phone] Fax: [place], [place] [address] [place] [address] Phone: [phone] Fax: [phone]  [facility] [place], [place] - [address] [place] [address] Phone: [phone] Fax: [phone]     Social Determinants of Health (SDOH) Interventions    Readmission Risk Interventions No flowsheet data found.

## 2019-04-25 NOTE — TOC Benefit Eligibility Note (Signed)
Transition of Care [facility]) Benefit Eligibility Note    Patient Details  Name: KENYA KOOK MRN: [id number] Date of Birth: 07-30-1931   Medication/Dose: Alveda Reasons  15 MG BID  Covered?: Yes  Tier: 3 Drug  Prescription Coverage Preferred Pharmacy: [facility]  Spoke with Person/Company/Phone Number:: JODY  @ [employer] # [phone]  Co-Pay: $ 47.00  Prior Approval: No  Deductible: Met  Additional Notes: XARELTO 20 MG  DAILY , COVER- YES, CO-PAY- $ 47.00, TIER- 3 DRUG , P/A-NO    Memory Argue Phone Number: 04/25/2019, 12:39 PM

## 2019-04-25 NOTE — Progress Notes (Signed)
PT Cancellation Note  Patient Details Name: TRINIA GEORGI MRN: [id number] DOB: March 02, 1931   Cancelled Treatment:    Reason Eval/Treat Not Completed: Other (comment) Per RN, patient is going home today. She states son is in room and patient has no needs, no change in status. She will be discharged with hospice services.     Nickolette Espinola 04/25/2019, 12:18 PM

## 2019-04-27 LAB — CULTURE, BLOOD (ROUTINE X 2): Culture: NO GROWTH

## 2019-07-30 DEATH — deceased

## 2020-06-08 IMAGING — CT CT ANGIOGRAPHY CHEST
1 of 6 series · 4 of 16 positions shown · IV contrast (omnipaque)
Comparison: No priors.

CLINICAL DATA: 88-year-old female with history of syncope,
hypotension, oxygen desaturation and elevated D-dimer. Suspected
pulmonary embolism.

EXAM:
CT ANGIOGRAPHY CHEST WITH CONTRAST
TECHNIQUE: Multidetector CT imaging of the chest was performed using the
standard protocol during bolus administration of intravenous
contrast. Multiplanar CT image reconstructions and MIPs were
obtained to evaluate the vascular anatomy.
CONTRAST:  100mL OMNIPAQUE IOHEXOL 350 MG/ML SOLN

[Series 12: pe thins · axial · 0.85mm/px · z∈[+1096,+1293]mm · 4 of 471 slices shown]
[im 95/471  lung]
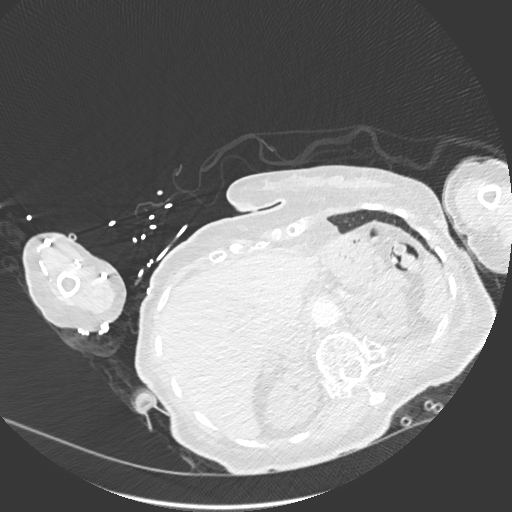
[im 189/471  soft-tissue]
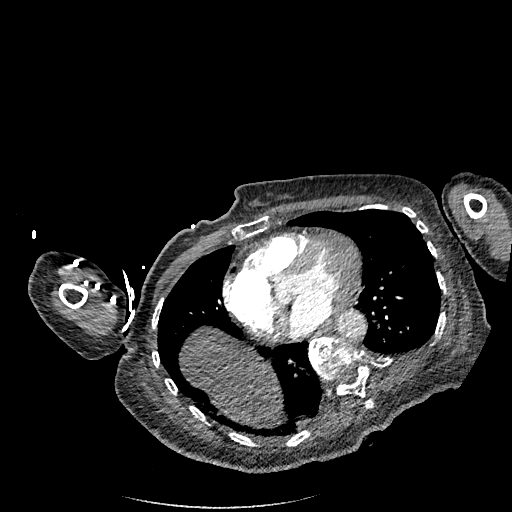
[im 283/471  lung]
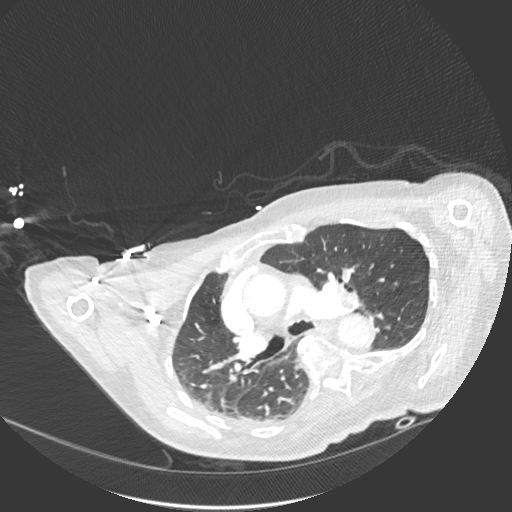
[im 377/471  soft-tissue]
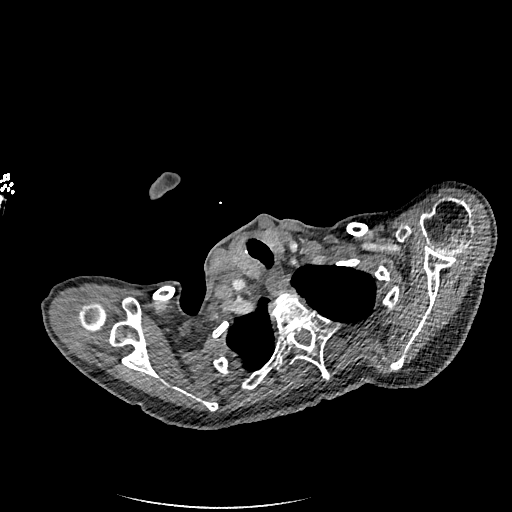

[4 of 16 positions shown; findings below may reference images not displayed]

FINDINGS: Cardiovascular: There is a small saddle embolus which extends into
the pulmonary arteries bilaterally. Multiple additional filling
defects are noted throughout lobar, segmental and subsegmental sized
branches in the pulmonary arterial tree bilaterally, indicative of
pulmonary embolus. These appear to be nonocclusive filling defects.
Heart size is normal. There is no significant pericardial fluid,
thickening or pericardial calcification. Pulmonic trunk is normal in
caliber at this time measuring 2.4 cm. There is aortic
atherosclerosis, as well as atherosclerosis of the great vessels of
the mediastinum and the coronary arteries, including calcified
atherosclerotic plaque in the left anterior descending, left
circumflex and right coronary arteries.

Mediastinum/Nodes: No pathologically enlarged mediastinal or hilar
lymph nodes. Esophagus is unremarkable in appearance. No axillary
lymphadenopathy.

Lungs/Pleura: Areas of dependent atelectasis are noted in the right
lower lobe. No acute consolidative airspace disease. No pleural
effusions. Mild fibrosis and scarring in the medial aspect of the
right lower lobe adjacent to several prominent osteophytes. No
suspicious appearing pulmonary nodules or masses are noted.

Upper Abdomen: Unremarkable.

Musculoskeletal: Chronic compression fracture of L1 with 20% loss of
anterior vertebral body height. There are no aggressive appearing
lytic or blastic lesions noted in the visualized portions of the
skeleton.

Review of the MIP images confirms the above findings.
IMPRESSION: 1. Study is positive for pulmonary embolism, including a small
saddle embolus, as well as a large burden of nonocclusive emboli
scattered throughout multiple lobar, segmental and subsegmental
sized branches in the pulmonary arterial tree bilaterally.
2. Aortic atherosclerosis, in addition to 3 vessel coronary artery
disease.

Critical Value/emergent results were called by telephone at the time
of interpretation on 04/23/2019 at [DATE] to Dr. Tijanic, who verbally
acknowledged these results.

Aortic Atherosclerosis ([id number]).
